# Patient Record
Sex: Male | Born: 1950
Health system: Southern US, Community
[De-identification: ages and names within clinical notes are randomized; demographics above are authoritative.]

## PROBLEM LIST (undated history)

## (undated) HISTORY — PX: MINOR CARPAL TUNNEL: SHX6472

---

## 2014-10-06 ENCOUNTER — Other Ambulatory Visit: Payer: Self-pay | Admitting: Physician Assistant

## 2014-10-06 DIAGNOSIS — R1030 Lower abdominal pain, unspecified: Secondary | ICD-10-CM

## 2014-10-08 ENCOUNTER — Ambulatory Visit
Admission: RE | Admit: 2014-10-08 | Discharge: 2014-10-08 | Disposition: A | Discharge status: Home / Self Care | Payer: 59 | Source: Ambulatory Visit | Attending: Physician Assistant | Admitting: Physician Assistant

## 2014-10-08 DIAGNOSIS — R1030 Lower abdominal pain, unspecified: Secondary | ICD-10-CM

## 2015-11-02 DIAGNOSIS — Z131 Encounter for screening for diabetes mellitus: Secondary | ICD-10-CM | POA: Diagnosis not present

## 2015-11-02 DIAGNOSIS — Z136 Encounter for screening for cardiovascular disorders: Secondary | ICD-10-CM | POA: Diagnosis not present

## 2015-11-02 DIAGNOSIS — Z0000 Encounter for general adult medical examination without abnormal findings: Secondary | ICD-10-CM | POA: Diagnosis not present

## 2015-11-02 DIAGNOSIS — Z23 Encounter for immunization: Secondary | ICD-10-CM | POA: Diagnosis not present

## 2015-11-02 DIAGNOSIS — Z125 Encounter for screening for malignant neoplasm of prostate: Secondary | ICD-10-CM | POA: Diagnosis not present

## 2015-11-02 DIAGNOSIS — L570 Actinic keratosis: Secondary | ICD-10-CM | POA: Diagnosis not present

## 2015-11-02 DIAGNOSIS — N400 Enlarged prostate without lower urinary tract symptoms: Secondary | ICD-10-CM | POA: Diagnosis not present

## 2016-02-09 DIAGNOSIS — D1801 Hemangioma of skin and subcutaneous tissue: Secondary | ICD-10-CM | POA: Diagnosis not present

## 2016-02-09 DIAGNOSIS — D0439 Carcinoma in situ of skin of other parts of face: Secondary | ICD-10-CM | POA: Diagnosis not present

## 2016-02-09 DIAGNOSIS — C44612 Basal cell carcinoma of skin of right upper limb, including shoulder: Secondary | ICD-10-CM | POA: Diagnosis not present

## 2016-02-09 DIAGNOSIS — L821 Other seborrheic keratosis: Secondary | ICD-10-CM | POA: Diagnosis not present

## 2016-02-09 DIAGNOSIS — L814 Other melanin hyperpigmentation: Secondary | ICD-10-CM | POA: Diagnosis not present

## 2016-02-09 DIAGNOSIS — L570 Actinic keratosis: Secondary | ICD-10-CM | POA: Diagnosis not present

## 2016-02-09 DIAGNOSIS — D045 Carcinoma in situ of skin of trunk: Secondary | ICD-10-CM | POA: Diagnosis not present

## 2016-02-09 DIAGNOSIS — D485 Neoplasm of uncertain behavior of skin: Secondary | ICD-10-CM | POA: Diagnosis not present

## 2016-03-01 DIAGNOSIS — C44519 Basal cell carcinoma of skin of other part of trunk: Secondary | ICD-10-CM | POA: Diagnosis not present

## 2016-03-01 DIAGNOSIS — D0439 Carcinoma in situ of skin of other parts of face: Secondary | ICD-10-CM | POA: Diagnosis not present

## 2016-08-18 DIAGNOSIS — L570 Actinic keratosis: Secondary | ICD-10-CM | POA: Diagnosis not present

## 2016-08-18 DIAGNOSIS — L821 Other seborrheic keratosis: Secondary | ICD-10-CM | POA: Diagnosis not present

## 2016-08-18 DIAGNOSIS — L814 Other melanin hyperpigmentation: Secondary | ICD-10-CM | POA: Diagnosis not present

## 2016-08-18 DIAGNOSIS — Z85828 Personal history of other malignant neoplasm of skin: Secondary | ICD-10-CM | POA: Diagnosis not present

## 2016-08-18 DIAGNOSIS — D1801 Hemangioma of skin and subcutaneous tissue: Secondary | ICD-10-CM | POA: Diagnosis not present

## 2016-09-22 DIAGNOSIS — L570 Actinic keratosis: Secondary | ICD-10-CM | POA: Diagnosis not present

## 2016-12-05 DIAGNOSIS — R7303 Prediabetes: Secondary | ICD-10-CM | POA: Diagnosis not present

## 2016-12-05 DIAGNOSIS — Z0000 Encounter for general adult medical examination without abnormal findings: Secondary | ICD-10-CM | POA: Diagnosis not present

## 2016-12-05 DIAGNOSIS — Z23 Encounter for immunization: Secondary | ICD-10-CM | POA: Diagnosis not present

## 2016-12-05 DIAGNOSIS — I10 Essential (primary) hypertension: Secondary | ICD-10-CM | POA: Diagnosis not present

## 2016-12-05 DIAGNOSIS — Z125 Encounter for screening for malignant neoplasm of prostate: Secondary | ICD-10-CM | POA: Diagnosis not present

## 2016-12-05 DIAGNOSIS — N400 Enlarged prostate without lower urinary tract symptoms: Secondary | ICD-10-CM | POA: Diagnosis not present

## 2017-01-03 DIAGNOSIS — L570 Actinic keratosis: Secondary | ICD-10-CM | POA: Diagnosis not present

## 2017-02-21 DIAGNOSIS — D1801 Hemangioma of skin and subcutaneous tissue: Secondary | ICD-10-CM | POA: Diagnosis not present

## 2017-02-21 DIAGNOSIS — Z85828 Personal history of other malignant neoplasm of skin: Secondary | ICD-10-CM | POA: Diagnosis not present

## 2017-02-21 DIAGNOSIS — D485 Neoplasm of uncertain behavior of skin: Secondary | ICD-10-CM | POA: Diagnosis not present

## 2017-02-21 DIAGNOSIS — L570 Actinic keratosis: Secondary | ICD-10-CM | POA: Diagnosis not present

## 2017-02-21 DIAGNOSIS — L821 Other seborrheic keratosis: Secondary | ICD-10-CM | POA: Diagnosis not present

## 2017-02-21 DIAGNOSIS — C44602 Unspecified malignant neoplasm of skin of right upper limb, including shoulder: Secondary | ICD-10-CM | POA: Diagnosis not present

## 2017-02-21 DIAGNOSIS — L814 Other melanin hyperpigmentation: Secondary | ICD-10-CM | POA: Diagnosis not present

## 2017-04-17 DIAGNOSIS — S52552A Other extraarticular fracture of lower end of left radius, initial encounter for closed fracture: Secondary | ICD-10-CM | POA: Diagnosis not present

## 2017-04-17 DIAGNOSIS — M25532 Pain in left wrist: Secondary | ICD-10-CM | POA: Diagnosis not present

## 2017-04-17 DIAGNOSIS — S20369A Insect bite (nonvenomous) of unspecified front wall of thorax, initial encounter: Secondary | ICD-10-CM | POA: Diagnosis not present

## 2017-04-17 DIAGNOSIS — W57XXXA Bitten or stung by nonvenomous insect and other nonvenomous arthropods, initial encounter: Secondary | ICD-10-CM | POA: Diagnosis not present

## 2017-04-19 DIAGNOSIS — C44622 Squamous cell carcinoma of skin of right upper limb, including shoulder: Secondary | ICD-10-CM | POA: Diagnosis not present

## 2017-05-01 DIAGNOSIS — S52552D Other extraarticular fracture of lower end of left radius, subsequent encounter for closed fracture with routine healing: Secondary | ICD-10-CM | POA: Diagnosis not present

## 2017-05-22 DIAGNOSIS — M25532 Pain in left wrist: Secondary | ICD-10-CM | POA: Diagnosis not present

## 2017-05-22 DIAGNOSIS — S52552D Other extraarticular fracture of lower end of left radius, subsequent encounter for closed fracture with routine healing: Secondary | ICD-10-CM | POA: Diagnosis not present

## 2017-06-15 DIAGNOSIS — R079 Chest pain, unspecified: Secondary | ICD-10-CM | POA: Diagnosis not present

## 2017-06-15 DIAGNOSIS — W11XXXA Fall on and from ladder, initial encounter: Secondary | ICD-10-CM | POA: Diagnosis not present

## 2017-06-19 DIAGNOSIS — M25532 Pain in left wrist: Secondary | ICD-10-CM | POA: Diagnosis not present

## 2017-06-19 DIAGNOSIS — S52552D Other extraarticular fracture of lower end of left radius, subsequent encounter for closed fracture with routine healing: Secondary | ICD-10-CM | POA: Diagnosis not present

## 2017-11-13 DIAGNOSIS — L570 Actinic keratosis: Secondary | ICD-10-CM | POA: Diagnosis not present

## 2017-11-13 DIAGNOSIS — L578 Other skin changes due to chronic exposure to nonionizing radiation: Secondary | ICD-10-CM | POA: Diagnosis not present

## 2017-11-13 DIAGNOSIS — L814 Other melanin hyperpigmentation: Secondary | ICD-10-CM | POA: Diagnosis not present

## 2017-11-13 DIAGNOSIS — Z85828 Personal history of other malignant neoplasm of skin: Secondary | ICD-10-CM | POA: Diagnosis not present

## 2017-11-13 DIAGNOSIS — L821 Other seborrheic keratosis: Secondary | ICD-10-CM | POA: Diagnosis not present

## 2018-05-07 DIAGNOSIS — Z85828 Personal history of other malignant neoplasm of skin: Secondary | ICD-10-CM | POA: Diagnosis not present

## 2018-05-07 DIAGNOSIS — L821 Other seborrheic keratosis: Secondary | ICD-10-CM | POA: Diagnosis not present

## 2018-05-07 DIAGNOSIS — L570 Actinic keratosis: Secondary | ICD-10-CM | POA: Diagnosis not present

## 2018-05-07 DIAGNOSIS — D225 Melanocytic nevi of trunk: Secondary | ICD-10-CM | POA: Diagnosis not present

## 2018-05-07 DIAGNOSIS — D1801 Hemangioma of skin and subcutaneous tissue: Secondary | ICD-10-CM | POA: Diagnosis not present

## 2018-11-12 DIAGNOSIS — L570 Actinic keratosis: Secondary | ICD-10-CM | POA: Diagnosis not present

## 2018-11-12 DIAGNOSIS — Z85828 Personal history of other malignant neoplasm of skin: Secondary | ICD-10-CM | POA: Diagnosis not present

## 2018-11-12 DIAGNOSIS — L578 Other skin changes due to chronic exposure to nonionizing radiation: Secondary | ICD-10-CM | POA: Diagnosis not present

## 2018-11-12 DIAGNOSIS — L600 Ingrowing nail: Secondary | ICD-10-CM | POA: Diagnosis not present

## 2018-11-27 ENCOUNTER — Encounter: Payer: Self-pay | Admitting: Podiatry

## 2018-11-27 ENCOUNTER — Ambulatory Visit (INDEPENDENT_AMBULATORY_CARE_PROVIDER_SITE_OTHER): Payer: Medicare Other | Admitting: Podiatry

## 2018-11-27 VITALS — BP 143/76 | HR 72

## 2018-11-27 DIAGNOSIS — L600 Ingrowing nail: Secondary | ICD-10-CM | POA: Diagnosis not present

## 2018-11-27 MED ORDER — GENTAMICIN SULFATE 0.1 % EX CREA: 1.0000 "application " | TOPICAL_CREAM | Freq: Two times a day (BID) | CUTANEOUS | 1 refills | Status: DC

## 2018-11-27 MED ORDER — DOXYCYCLINE HYCLATE 100 MG PO TABS: 100.0000 mg | ORAL_TABLET | Freq: Two times a day (BID) | ORAL | 0 refills | Status: DC

## 2018-11-29 ENCOUNTER — Telehealth: Payer: Self-pay | Admitting: Podiatry

## 2018-11-29 MED ORDER — GENTAMICIN SULFATE 0.1 % EX CREA: 1.0000 "application " | TOPICAL_CREAM | Freq: Two times a day (BID) | CUTANEOUS | 1 refills | Status: DC

## 2018-11-29 NOTE — Telephone Encounter (Signed)
I called and spoke with patient, he is going to call a couple of different pharmacies to see if he can get Gentamicin cream cheaper and let me know where to send it.  He stated that [facility] is too expensive

## 2018-11-29 NOTE — Progress Notes (Signed)
   Subjective: Patient presents today for evaluation of pain to the lateral border of the left hallux that began about one month ago. He reports associated redness, swelling and purulent drainage. Patient is concerned for possible ingrown nail. Wearing shoes increases the pain. He has not done anything for treatment. Patient presents today for further treatment and evaluation.  History reviewed. No pertinent past medical history.  Objective:  General: Well developed, nourished, in no acute distress, alert and oriented x3   Dermatology: Skin is warm, dry and supple bilateral. Lateral border of the left hallux appears to be erythematous with evidence of an ingrowing nail. Pain on palpation noted to the border of the nail fold. The remaining nails appear unremarkable at this time. There are no open sores, lesions.  Vascular: Dorsalis Pedis artery and Posterior Tibial artery pedal pulses palpable. No lower extremity edema noted.   Neruologic: Grossly intact via light touch bilateral.  Musculoskeletal: Muscular strength within normal limits in all groups bilateral. Normal range of motion noted to all pedal and ankle joints.   Assesement: #1 Paronychia with ingrowing nail lateral border left hallux  #2 Pain in toe #3 Incurvated nail  Plan of Care:  1. Patient evaluated.  2. Discussed treatment alternatives and plan of care. Explained nail avulsion procedure and post procedure course to patient. 3. Patient opted for temporary partial nail avulsion of the lateral border of the left hallux.  4. Prior to procedure, local anesthesia infiltration utilized using 3 ml of a 50:50 mixture of 2% plain lidocaine and 0.5% plain marcaine in a normal hallux block fashion and a betadine prep performed.  5. Light dressing applied. 6. Prescription for Gentamicin cream provided to patient to use daily with a bandage.  7. Prescription for Doxycycline #20 provided to patient.  8. Return to clinic in 3 weeks.    Edrick Kins, DPM [facility]  Dr. Edrick Kins, [address]                                        [place], [place] [address]                Office [phone]  Fax [phone]

## 2018-11-29 NOTE — Telephone Encounter (Signed)
I'm calling about the medication being worked on for me that's supposed to go to [facility].

## 2019-01-15 DIAGNOSIS — N400 Enlarged prostate without lower urinary tract symptoms: Secondary | ICD-10-CM | POA: Diagnosis not present

## 2019-01-15 DIAGNOSIS — Z0000 Encounter for general adult medical examination without abnormal findings: Secondary | ICD-10-CM | POA: Diagnosis not present

## 2020-12-17 DIAGNOSIS — D485 Neoplasm of uncertain behavior of skin: Secondary | ICD-10-CM | POA: Diagnosis not present

## 2020-12-17 DIAGNOSIS — L814 Other melanin hyperpigmentation: Secondary | ICD-10-CM | POA: Diagnosis not present

## 2020-12-17 DIAGNOSIS — L570 Actinic keratosis: Secondary | ICD-10-CM | POA: Diagnosis not present

## 2020-12-17 DIAGNOSIS — C44529 Squamous cell carcinoma of skin of other part of trunk: Secondary | ICD-10-CM | POA: Diagnosis not present

## 2020-12-17 DIAGNOSIS — L439 Lichen planus, unspecified: Secondary | ICD-10-CM | POA: Diagnosis not present

## 2020-12-17 DIAGNOSIS — L905 Scar conditions and fibrosis of skin: Secondary | ICD-10-CM | POA: Diagnosis not present

## 2020-12-17 DIAGNOSIS — C44519 Basal cell carcinoma of skin of other part of trunk: Secondary | ICD-10-CM | POA: Diagnosis not present

## 2020-12-25 ENCOUNTER — Ambulatory Visit
Admission: EM | Admit: 2020-12-25 | Discharge: 2020-12-25 | Disposition: A | Discharge status: Home / Self Care | Payer: Medicare Other | Attending: Emergency Medicine | Admitting: Emergency Medicine

## 2020-12-25 ENCOUNTER — Other Ambulatory Visit: Payer: Self-pay

## 2020-12-25 ENCOUNTER — Ambulatory Visit (INDEPENDENT_AMBULATORY_CARE_PROVIDER_SITE_OTHER): Payer: Medicare Other

## 2020-12-25 DIAGNOSIS — J189 Pneumonia, unspecified organism: Secondary | ICD-10-CM

## 2020-12-25 DIAGNOSIS — J90 Pleural effusion, not elsewhere classified: Secondary | ICD-10-CM | POA: Diagnosis not present

## 2020-12-25 DIAGNOSIS — R079 Chest pain, unspecified: Secondary | ICD-10-CM

## 2020-12-25 MED ORDER — AMOXICILLIN-POT CLAVULANATE 875-125 MG PO TABS: 1.0000 | ORAL_TABLET | Freq: Two times a day (BID) | ORAL | 0 refills | Status: AC

## 2020-12-25 MED ORDER — AZITHROMYCIN 250 MG PO TABS: 250.0000 mg | ORAL_TABLET | Freq: Every day | ORAL | 0 refills | Status: DC

## 2020-12-25 NOTE — ED Provider Notes (Signed)
[facility]    CSN: [id number] Arrival date & time: 12/25/20  1348      History   Chief Complaint Chief Complaint  Patient presents with  . Rib Injury         HPI Stephen Cortez is a 70 y.o. male presenting today for evaluation of right-sided rib pain.  Reports woke up this morning around 4 AM with discomfort in his right rib cage.  Had associated shortness of breath and discomfort when taking a deep breath.  Denies symptoms at rest.  He denies any recent fevers, has felt slightly achy of recently.  Denies any injury fall or trauma to this area.  Denies history of DVT/PE.  Denies leg pain or leg swelling.  Denies tobacco use.  Denies any recent travel immobilization or surgeries.  Patient reports he walks approximately 3 miles per day.  HPI  History reviewed. No pertinent past medical history.  There are no problems to display for this patient.   History reviewed. No pertinent surgical history.     Home Medications    Prior to Admission medications   Medication Sig Start Date End Date Taking? Authorizing Provider  amoxicillin-clavulanate (AUGMENTIN) 875-125 MG tablet Take 1 tablet by mouth every 12 (twelve) hours for 7 days. 12/25/20 01/01/21 Yes Enrique Weiss C, PA-C  azithromycin (ZITHROMAX) 250 MG tablet Take 1 tablet (250 mg total) by mouth daily. Take first 2 tablets together, then 1 every day until finished. 12/25/20  Yes Reveca Desmarais C, PA-C  gentamicin cream (GARAMYCIN) 0.1 % Apply 1 application topically 2 (two) times daily. 11/29/18   Edrick Kins, DPM    Family History History reviewed. No pertinent family history.  Social History Social History   Tobacco Use  . Smoking status: Never Smoker  . Smokeless tobacco: Never Used     Allergies   Patient has no known allergies.   Review of Systems Review of Systems  Constitutional: Negative for activity change, appetite change, chills, fatigue and fever.  HENT: Negative for congestion,  ear pain, rhinorrhea, sinus pressure, sore throat and trouble swallowing.   Eyes: Negative for discharge and redness.  Respiratory: Negative for cough, chest tightness and shortness of breath.   Cardiovascular: Positive for chest pain.  Gastrointestinal: Negative for abdominal pain, diarrhea, nausea and vomiting.  Musculoskeletal: Negative for myalgias.  Skin: Negative for rash.  Neurological: Negative for dizziness, light-headedness and headaches.     Physical Exam Triage Vital Signs ED Triage Vitals  Enc Vitals Group     BP 12/25/20 1406 104/63     Pulse Rate 12/25/20 1406 78     Resp 12/25/20 1406 18     Temp 12/25/20 1406 98.3 F (36.8 C)     Temp Source 12/25/20 1406 Oral     SpO2 12/25/20 1406 94 %     Weight --      Height --      Head Circumference --      Peak Flow --      Pain Score 12/25/20 1407 3     Pain Loc --      Pain Edu? --      Excl. in [place]? --    No data found.  Updated Vital Signs BP 104/63 (BP Location: Right Arm)   Pulse 78   Temp 98.3 F (36.8 C) (Oral)   Resp 18   SpO2 94%   Visual Acuity Right Eye Distance:   Left Eye Distance:   Bilateral Distance:  Right Eye Near:   Left Eye Near:    Bilateral Near:     Physical Exam Vitals and nursing note reviewed.  Constitutional:      Appearance: He is well-developed.     Comments: No acute distress  HENT:     Head: Normocephalic and atraumatic.     Nose: Nose normal.  Eyes:     Conjunctiva/sclera: Conjunctivae normal.  Cardiovascular:     Rate and Rhythm: Normal rate.  Pulmonary:     Effort: Pulmonary effort is normal. No respiratory distress.     Comments: Breathing comfortably at rest, CTABL, no wheezing, rales or other adventitious sounds auscultated  No reproducible chest tenderness Abdominal:     General: There is no distension.  Musculoskeletal:        General: Normal range of motion.     Cervical back: Neck supple.     Comments: Bilateral lower legs without swelling or  calf tenderness  Skin:    General: Skin is warm and dry.  Neurological:     Mental Status: He is alert and oriented to person, place, and time.      UC Treatments / Results  Labs (all labs ordered are listed, but only abnormal results are displayed) Labs Reviewed - No data to display  EKG   Radiology No results found.  Procedures Procedures (including critical care time)  Medications Ordered in UC Medications - No data to display  Initial Impression / Assessment and Plan / UC Course  I have reviewed the triage vital signs and the nursing notes.  Pertinent labs & imaging results that were available during my care of the patient were reviewed by me and considered in my medical decision making (see chart for details).     Right middle lobe pneumonia-initiating on Augmentin and azithromycin, rest and fluids.  Advised to monitor for improvement in pain, low suspicion of DVT, no tachycardia, negative risk factors, but encourage patient to follow-up in emergency room if developing worsening symptoms/pain.  Discussed strict return precautions. Patient verbalized understanding and is agreeable with plan.  Final Clinical Impressions(s) / UC Diagnoses   Final diagnoses:  Pneumonia of right middle lobe due to infectious organism     Discharge Instructions     Begin Augmentin twice daily for 1 week Azithromycin-2 tablets today, 1 tablet for the following 4 days Rest and fluids Follow-up to ensure resolution in approximately 4 weeks     ED Prescriptions    Medication Sig Dispense Auth. Provider   azithromycin (ZITHROMAX) 250 MG tablet Take 1 tablet (250 mg total) by mouth daily. Take first 2 tablets together, then 1 every day until finished. 6 tablet Mishawn Didion C, PA-C   amoxicillin-clavulanate (AUGMENTIN) 875-125 MG tablet Take 1 tablet by mouth every 12 (twelve) hours for 7 days. 14 tablet Allina Riches, [place] C, PA-C     PDMP not reviewed this encounter.   Janith Lima, PA-C 12/25/20 1547

## 2020-12-25 NOTE — ED Triage Notes (Signed)
Pt present rib cage pain. Pt states he felt the pain today. Pt states he cannot take a full deep breath. Pt states he is not sure if he injured his rib but it is painful

## 2020-12-25 NOTE — Discharge Instructions (Signed)
Begin Augmentin twice daily for 1 week Azithromycin-2 tablets today, 1 tablet for the following 4 days Rest and fluids Follow-up to ensure resolution in approximately 4 weeks

## 2021-01-11 DIAGNOSIS — C44529 Squamous cell carcinoma of skin of other part of trunk: Secondary | ICD-10-CM | POA: Diagnosis not present

## 2021-01-11 DIAGNOSIS — L570 Actinic keratosis: Secondary | ICD-10-CM | POA: Diagnosis not present

## 2021-02-09 ENCOUNTER — Ambulatory Visit (INDEPENDENT_AMBULATORY_CARE_PROVIDER_SITE_OTHER): Payer: Medicare Other

## 2021-02-09 ENCOUNTER — Telehealth: Payer: Self-pay | Admitting: Emergency Medicine

## 2021-02-09 ENCOUNTER — Encounter: Payer: Self-pay | Admitting: Emergency Medicine

## 2021-02-09 ENCOUNTER — Ambulatory Visit (INDEPENDENT_AMBULATORY_CARE_PROVIDER_SITE_OTHER): Payer: Medicare Other | Admitting: Emergency Medicine

## 2021-02-09 ENCOUNTER — Other Ambulatory Visit: Payer: Self-pay

## 2021-02-09 ENCOUNTER — Telehealth: Payer: Self-pay

## 2021-02-09 VITALS — BP 120/70 | HR 63 | Temp 98.0°F | Ht 67.0 in | Wt 183.6 lb

## 2021-02-09 DIAGNOSIS — M545 Low back pain, unspecified: Secondary | ICD-10-CM | POA: Diagnosis not present

## 2021-02-09 DIAGNOSIS — Z7689 Persons encountering health services in other specified circumstances: Secondary | ICD-10-CM | POA: Diagnosis not present

## 2021-02-09 DIAGNOSIS — Z8701 Personal history of pneumonia (recurrent): Secondary | ICD-10-CM | POA: Diagnosis not present

## 2021-02-09 DIAGNOSIS — J9811 Atelectasis: Secondary | ICD-10-CM | POA: Diagnosis not present

## 2021-02-09 DIAGNOSIS — M549 Dorsalgia, unspecified: Secondary | ICD-10-CM

## 2021-02-09 DIAGNOSIS — J189 Pneumonia, unspecified organism: Secondary | ICD-10-CM | POA: Diagnosis not present

## 2021-02-09 NOTE — Assessment & Plan Note (Signed)
Chest x-ray shows resolved pneumonia findings.  Clinically improved and asymptomatic.

## 2021-02-09 NOTE — Assessment & Plan Note (Signed)
Musculoskeletal in nature.  No red flag signs or symptoms.  X-rays show some degenerative joint disease and arthritic changes.  Recommend to use Tylenol and or NSAIDs as needed.  Encouraged to stay active and also use cold or hot compresses as needed.

## 2021-02-09 NOTE — Telephone Encounter (Signed)
Mild chronic bronchitic changes with resolved RIGHT lower lung infiltrates since previous exam.  Mild residual RIGHT basilar atelectasis.  Question RIGHT nipple shadow; PA chest radiograph with nipple markers recommended to exclude pulmonary nodule.

## 2021-02-09 NOTE — Telephone Encounter (Signed)
X-ray reports shared with patient.

## 2021-02-09 NOTE — Telephone Encounter (Signed)
Got it!     Thanks =).

## 2021-02-09 NOTE — Progress Notes (Signed)
Stephen Cortez 70 y.o.   Chief Complaint  Patient presents with  . Establish Care  . Back Pain    Per patient lower area started 2-3 weeks ago    HISTORY OF PRESENT ILLNESS: This is a 70 y.o. male here to establish care with me. Also complaining of lower back pain for the past 2 to 3 weeks which started after heavy lifting and very physical work. Sharp pain localized to lumbar area worse in the morning and better with movement and physical activity.  No pain radiation. Denies bowel or bladder symptoms.  No other associated symptoms. Also had pneumonia last March.  Went to urgent care center and had x-ray which showed right middle lobe airspace disease most consistent with pneumonia.  Treated with azithromycin.  No complications.  Improved 100%.  However he was sent a letter asking him to follow-up with repeat chest x-ray. Patient denies any chronic medical problems.  No chronic medications. No other complaints or medical concerns today.  HPI   Prior to Admission medications   Medication Sig Start Date End Date Taking? Authorizing Provider  azithromycin (ZITHROMAX) 250 MG tablet Take 1 tablet (250 mg total) by mouth daily. Take first 2 tablets together, then 1 every day until finished. Patient not taking: Reported on 02/09/2021 12/25/20   Wieters, Madelynn Done C, PA-C  gentamicin cream (GARAMYCIN) 0.1 % Apply 1 application topically 2 (two) times daily. Patient not taking: Reported on 02/09/2021 11/29/18   Edrick Kins, DPM    No Known Allergies  There are no problems to display for this patient.   History reviewed. No pertinent past medical history.  History reviewed. No pertinent surgical history.  Social History   Socioeconomic History  . Marital status: Married    Spouse name: Not on file  . Number of children: Not on file  . Years of education: Not on file  . Highest education level: Not on file  Occupational History  . Not on file  Tobacco Use  . Smoking status: Never  Smoker  . Smokeless tobacco: Never Used  Substance and Sexual Activity  . Alcohol use: Not on file  . Drug use: Not on file  . Sexual activity: Not on file  Other Topics Concern  . Not on file  Social History Narrative  . Not on file   Social Determinants of Health   Financial Resource Strain: Not on file  Food Insecurity: Not on file  Transportation Needs: Not on file  Physical Activity: Not on file  Stress: Not on file  Social Connections: Not on file  Intimate Partner Violence: Not on file    History reviewed. No pertinent family history.   Review of Systems  Constitutional: Negative.  Negative for chills and fever.  HENT: Negative.  Negative for congestion and sore throat.   Respiratory: Negative.  Negative for cough, hemoptysis, sputum production, shortness of breath and wheezing.   Cardiovascular: Negative.  Negative for chest pain and palpitations.  Gastrointestinal: Negative for abdominal pain, diarrhea, nausea and vomiting.  Genitourinary: Negative.  Negative for dysuria and hematuria.  Skin: Negative.  Negative for rash.  Neurological: Negative.  Negative for dizziness and headaches.  All other systems reviewed and are negative.   Today's Vitals   02/09/21 1114  BP: 120/70  Pulse: 63  Temp: 98 F (36.7 C)  TempSrc: Oral  SpO2: 98%  Weight: 183 lb 9.6 oz (83.3 kg)  Height: 5\' 7"  (1.702 m)   Body mass index is 28.76  kg/m. Wt Readings from Last 3 Encounters:  02/09/21 183 lb 9.6 oz (83.3 kg)    Physical Exam Vitals reviewed.  Constitutional:      Appearance: Normal appearance.  HENT:     Head: Normocephalic.  Eyes:     Extraocular Movements: Extraocular movements intact.     Conjunctiva/sclera: Conjunctivae normal.     Pupils: Pupils are equal, round, and reactive to light.  Cardiovascular:     Rate and Rhythm: Normal rate and regular rhythm.     Pulses: Normal pulses.     Heart sounds: Normal heart sounds.  Pulmonary:     Effort: Pulmonary  effort is normal.     Breath sounds: Normal breath sounds.  Abdominal:     General: Bowel sounds are normal. There is no distension.     Palpations: Abdomen is soft.     Tenderness: There is no abdominal tenderness.  Musculoskeletal:        General: Normal range of motion.     Cervical back: Normal range of motion and neck supple.     Lumbar back: No spasms, tenderness or bony tenderness. Normal range of motion. Negative right straight leg raise test and negative left straight leg raise test.  Skin:    General: Skin is warm and dry.     Capillary Refill: Capillary refill takes less than 2 seconds.  Neurological:     General: No focal deficit present.     Mental Status: He is alert and oriented to person, place, and time.     Sensory: No sensory deficit.     Motor: No weakness.     Coordination: Coordination normal.     Gait: Gait normal.     Deep Tendon Reflexes: Reflexes normal.  Psychiatric:        Mood and Affect: Mood normal.        Behavior: Behavior normal.    DG Chest 2 View  Result Date: 02/09/2021 CLINICAL DATA:  Recent pneumonia, follow-up EXAM: CHEST - 2 VIEW COMPARISON:  12/25/2020 FINDINGS: Normal heart size and pulmonary vascularity. Tortuosity of thoracic aorta. Chronic bronchitic changes with improved infiltrate in lower RIGHT lung. Residual atelectasis at minor fissure. Remaining lungs clear. Question nodular density inferior RIGHT chest versus nipple shadow. No pneumothorax or acute osseous findings. IMPRESSION: Mild chronic bronchitic changes with resolved RIGHT lower lung infiltrates since previous exam. Mild residual RIGHT basilar atelectasis. Question RIGHT nipple shadow; PA chest radiograph with nipple markers recommended to exclude pulmonary nodule. These results will be called to the ordering clinician or representative by the Radiologist Assistant, and communication documented in the PACS or [employer]. Electronically Signed   By: Lavonia Dana M.D.   On:  02/09/2021 12:14   DG Lumbar Spine 2-3 Views  Result Date: 02/09/2021 CLINICAL DATA:  Low back pain for 3 weeks, no known injury EXAM: LUMBAR SPINE - 2-3 VIEW COMPARISON:  None FINDINGS: Osseous demineralization. Hypoplastic last LEFT ribs. Five non-rib-bearing lumbar vertebra. Dextroconvex scoliosis with scattered disc space narrowing and endplate spur formation greatest at L2-L3. No fracture, subluxation, or bone destruction. No spondylolysis. Stable radiopacities within stool. Cannot exclude small LEFT renal calculi up to 3 mm diameter. IMPRESSION: Degenerative disc disease changes lumbar spine with dextroconvex scoliosis and osseous demineralization. No acute osseous findings. Cannot exclude tiny LEFT renal calculi. Electronically Signed   By: Lavonia Dana M.D.   On: 02/09/2021 12:17     ASSESSMENT & PLAN: History of recent pneumonia Chest x-ray shows resolved pneumonia findings.  Clinically improved and asymptomatic.  Lumbar pain Musculoskeletal in nature.  No red flag signs or symptoms.  X-rays show some degenerative joint disease and arthritic changes.  Recommend to use Tylenol and or NSAIDs as needed.  Encouraged to stay active and also use cold or hot compresses as needed.  Carrington was seen today for establish care and back pain.  Diagnoses and all orders for this visit:  Lumbar pain -     DG Lumbar Spine 2-3 Views  History of recent pneumonia -     DG Chest 2 View  Encounter to establish care  Musculoskeletal back pain    Patient Instructions   Acute Back Pain, Adult Acute back pain is sudden and usually short-lived. It is often caused by an injury to the muscles and tissues in the back. The injury may result from:  A muscle or ligament getting overstretched or torn (strained). Ligaments are tissues that connect bones to each other. Lifting something improperly can cause a back strain.  Wear and tear (degeneration) of the spinal disks. Spinal disks are circular tissue  that provide cushioning between the bones of the spine (vertebrae).  Twisting motions, such as while playing sports or doing yard work.  A hit to the back.  Arthritis. You may have a physical exam, lab tests, and imaging tests to find the cause of your pain. Acute back pain usually goes away with rest and home care. Follow these instructions at home: Managing pain, stiffness, and swelling  Treatment may include medicines for pain and inflammation that are taken by mouth or applied to the skin, prescription pain medicine, or muscle relaxants. Take over-the-counter and prescription medicines only as told by your health care provider.  Your health care provider may recommend applying ice during the first 24-48 hours after your pain starts. To do this: ? Put ice in a plastic bag. ? Place a towel between your skin and the bag. ? Leave the ice on for 20 minutes, 2-3 times a day.  If directed, apply heat to the affected area as often as told by your health care provider. Use the heat source that your health care provider recommends, such as a moist heat pack or a heating pad. ? Place a towel between your skin and the heat source. ? Leave the heat on for 20-30 minutes. ? Remove the heat if your skin turns bright red. This is especially important if you are unable to feel pain, heat, or cold. You have a greater risk of getting burned. Activity  Do not stay in bed. Staying in bed for more than 1-2 days can delay your recovery.  Sit up and stand up straight. Avoid leaning forward when you sit or hunching over when you stand. ? If you work at a desk, sit close to it so you do not need to lean over. Keep your chin tucked in. Keep your neck drawn back, and keep your elbows bent at a 90-degree angle (right angle). ? Sit high and close to the steering wheel when you drive. Add lower back (lumbar) support to your car seat, if needed.  Take short walks on even surfaces as soon as you are able. Try to  increase the length of time you walk each day.  Do not sit, drive, or stand in one place for more than 30 minutes at a time. Sitting or standing for long periods of time can put stress on your back.  Do not drive or use heavy machinery while  taking prescription pain medicine.  Use proper lifting techniques. When you bend and lift, use positions that put less stress on your back: ? Shannon your knees. ? Keep the load close to your body. ? Avoid twisting.  Exercise regularly as told by your health care provider. Exercising helps your back heal faster and helps prevent back injuries by keeping muscles strong and flexible.  Work with a physical therapist to make a safe exercise program, as recommended by your health care provider. Do any exercises as told by your physical therapist.   Lifestyle  Maintain a healthy weight. Extra weight puts stress on your back and makes it difficult to have good posture.  Avoid activities or situations that make you feel anxious or stressed. Stress and anxiety increase muscle tension and can make back pain worse. Learn ways to manage anxiety and stress, such as through exercise. General instructions  Sleep on a firm mattress in a comfortable position. Try lying on your side with your knees slightly bent. If you lie on your back, put a pillow under your knees.  Follow your treatment plan as told by your health care provider. This may include: ? Cognitive or behavioral therapy. ? Acupuncture or massage therapy. ? Meditation or yoga. Contact a health care provider if:  You have pain that is not relieved with rest or medicine.  You have increasing pain going down into your legs or buttocks.  Your pain does not improve after 2 weeks.  You have pain at night.  You lose weight without trying.  You have a fever or chills. Get help right away if:  You develop new bowel or bladder control problems.  You have unusual weakness or numbness in your arms or  legs.  You develop nausea or vomiting.  You develop abdominal pain.  You feel faint. Summary  Acute back pain is sudden and usually short-lived.  Use proper lifting techniques. When you bend and lift, use positions that put less stress on your back.  Take over-the-counter and prescription medicines and apply heat or ice as directed by your health care provider. This information is not intended to replace advice given to you by your health care provider. Make sure you discuss any questions you have with your health care provider. Document Revised: 06/19/2020 Document Reviewed: 06/19/2020 Elsevier Patient Education  2021 Max, MD [facility]

## 2021-02-09 NOTE — Patient Instructions (Signed)
Acute Back Pain, Adult Acute back pain is sudden and usually short-lived. It is often caused by an injury to the muscles and tissues in the back. The injury may result from:  A muscle or ligament getting overstretched or torn (strained). Ligaments are tissues that connect bones to each other. Lifting something improperly can cause a back strain.  Wear and tear (degeneration) of the spinal disks. Spinal disks are circular tissue that provide cushioning between the bones of the spine (vertebrae).  Twisting motions, such as while playing sports or doing yard work.  A hit to the back.  Arthritis. You may have a physical exam, lab tests, and imaging tests to find the cause of your pain. Acute back pain usually goes away with rest and home care. Follow these instructions at home: Managing pain, stiffness, and swelling  Treatment may include medicines for pain and inflammation that are taken by mouth or applied to the skin, prescription pain medicine, or muscle relaxants. Take over-the-counter and prescription medicines only as told by your health care provider.  Your health care provider may recommend applying ice during the first 24-48 hours after your pain starts. To do this: ? Put ice in a plastic bag. ? Place a towel between your skin and the bag. ? Leave the ice on for 20 minutes, 2-3 times a day.  If directed, apply heat to the affected area as often as told by your health care provider. Use the heat source that your health care provider recommends, such as a moist heat pack or a heating pad. ? Place a towel between your skin and the heat source. ? Leave the heat on for 20-30 minutes. ? Remove the heat if your skin turns bright red. This is especially important if you are unable to feel pain, heat, or cold. You have a greater risk of getting burned. Activity  Do not stay in bed. Staying in bed for more than 1-2 days can delay your recovery.  Sit up and stand up straight. Avoid leaning  forward when you sit or hunching over when you stand. ? If you work at a desk, sit close to it so you do not need to lean over. Keep your chin tucked in. Keep your neck drawn back, and keep your elbows bent at a 90-degree angle (right angle). ? Sit high and close to the steering wheel when you drive. Add lower back (lumbar) support to your car seat, if needed.  Take short walks on even surfaces as soon as you are able. Try to increase the length of time you walk each day.  Do not sit, drive, or stand in one place for more than 30 minutes at a time. Sitting or standing for long periods of time can put stress on your back.  Do not drive or use heavy machinery while taking prescription pain medicine.  Use proper lifting techniques. When you bend and lift, use positions that put less stress on your back: ? Bend your knees. ? Keep the load close to your body. ? Avoid twisting.  Exercise regularly as told by your health care provider. Exercising helps your back heal faster and helps prevent back injuries by keeping muscles strong and flexible.  Work with a physical therapist to make a safe exercise program, as recommended by your health care provider. Do any exercises as told by your physical therapist.   Lifestyle  Maintain a healthy weight. Extra weight puts stress on your back and makes it difficult to have   good posture.  Avoid activities or situations that make you feel anxious or stressed. Stress and anxiety increase muscle tension and can make back pain worse. Learn ways to manage anxiety and stress, such as through exercise. General instructions  Sleep on a firm mattress in a comfortable position. Try lying on your side with your knees slightly bent. If you lie on your back, put a pillow under your knees.  Follow your treatment plan as told by your health care provider. This may include: ? Cognitive or behavioral therapy. ? Acupuncture or massage therapy. ? Meditation or yoga. Contact  a health care provider if:  You have pain that is not relieved with rest or medicine.  You have increasing pain going down into your legs or buttocks.  Your pain does not improve after 2 weeks.  You have pain at night.  You lose weight without trying.  You have a fever or chills. Get help right away if:  You develop new bowel or bladder control problems.  You have unusual weakness or numbness in your arms or legs.  You develop nausea or vomiting.  You develop abdominal pain.  You feel faint. Summary  Acute back pain is sudden and usually short-lived.  Use proper lifting techniques. When you bend and lift, use positions that put less stress on your back.  Take over-the-counter and prescription medicines and apply heat or ice as directed by your health care provider. This information is not intended to replace advice given to you by your health care provider. Make sure you discuss any questions you have with your health care provider. Document Revised: 06/19/2020 Document Reviewed: 06/19/2020 Elsevier Patient Education  2021 [employer]  

## 2021-02-23 DIAGNOSIS — L244 Irritant contact dermatitis due to drugs in contact with skin: Secondary | ICD-10-CM | POA: Diagnosis not present

## 2021-03-24 DIAGNOSIS — L905 Scar conditions and fibrosis of skin: Secondary | ICD-10-CM | POA: Diagnosis not present

## 2021-03-24 DIAGNOSIS — C44519 Basal cell carcinoma of skin of other part of trunk: Secondary | ICD-10-CM | POA: Diagnosis not present

## 2021-12-08 DIAGNOSIS — L570 Actinic keratosis: Secondary | ICD-10-CM | POA: Diagnosis not present

## 2021-12-08 DIAGNOSIS — L814 Other melanin hyperpigmentation: Secondary | ICD-10-CM | POA: Diagnosis not present

## 2021-12-08 DIAGNOSIS — L821 Other seborrheic keratosis: Secondary | ICD-10-CM | POA: Diagnosis not present

## 2021-12-08 DIAGNOSIS — D1801 Hemangioma of skin and subcutaneous tissue: Secondary | ICD-10-CM | POA: Diagnosis not present

## 2022-01-05 DIAGNOSIS — L244 Irritant contact dermatitis due to drugs in contact with skin: Secondary | ICD-10-CM | POA: Diagnosis not present

## 2022-06-03 ENCOUNTER — Ambulatory Visit
Admission: EM | Admit: 2022-06-03 | Discharge: 2022-06-03 | Disposition: A | Discharge status: Home / Self Care | Payer: Medicare Other | Attending: Physician Assistant | Admitting: Physician Assistant

## 2022-06-03 ENCOUNTER — Ambulatory Visit (INDEPENDENT_AMBULATORY_CARE_PROVIDER_SITE_OTHER): Payer: Medicare Other

## 2022-06-03 DIAGNOSIS — Z23 Encounter for immunization: Secondary | ICD-10-CM

## 2022-06-03 DIAGNOSIS — L089 Local infection of the skin and subcutaneous tissue, unspecified: Secondary | ICD-10-CM | POA: Diagnosis not present

## 2022-06-03 DIAGNOSIS — S61210A Laceration without foreign body of right index finger without damage to nail, initial encounter: Secondary | ICD-10-CM

## 2022-06-03 DIAGNOSIS — M7989 Other specified soft tissue disorders: Secondary | ICD-10-CM

## 2022-06-03 MED ORDER — MUPIROCIN 2 % EX OINT: 1.0000 | TOPICAL_OINTMENT | Freq: Two times a day (BID) | CUTANEOUS | 0 refills | Status: DC

## 2022-06-03 MED ORDER — SULFAMETHOXAZOLE-TRIMETHOPRIM 800-160 MG PO TABS: 1.0000 | ORAL_TABLET | Freq: Two times a day (BID) | ORAL | 0 refills | Status: AC

## 2022-06-03 MED ORDER — TETANUS-DIPHTH-ACELL PERTUSSIS 5-2.5-18.5 LF-MCG/0.5 IM SUSY
0.5000 mL | PREFILLED_SYRINGE | Freq: Once | INTRAMUSCULAR | Status: AC
  Administered 2022-06-03: 0.5 mL via INTRAMUSCULAR

## 2022-06-03 MED ORDER — CEPHALEXIN 500 MG PO CAPS: 500.0000 mg | ORAL_CAPSULE | Freq: Three times a day (TID) | ORAL | 0 refills | Status: DC

## 2022-06-03 NOTE — Discharge Instructions (Signed)
Your x-ray showed swelling but was otherwise normal.  I am concerned that you have an infection that is causing your swelling.  Please start cephalexin 3 times daily for 1 week.  Start sulfamethoxazole-trimethoprim twice daily for 1 week.  Keep area clean and apply Bactroban ointment.  If your symptoms or not improving within a few days call and schedule appoint with hand specialist.  If you develop any increased pain, swelling, fever, nausea, vomiting, numbness or tingling, decreased range of motion you need to go to the emergency room immediately.

## 2022-06-03 NOTE — ED Provider Notes (Signed)
[facility]    CSN: [id number] Arrival date & time: 06/03/22  1338      History   Chief Complaint Chief Complaint  Patient presents with   Laceration    HPI Stephen Cortez is a 71 y.o. male.   Patient presents today with a 6-day history of right index finger pain and swelling following injury.  Reports that he cut himself while working on a car.  He initially cleaned this area with soap and water and applied Neosporin.  He then would have recurrent bleeding so applied liquid bandage.  This did achieve hemostasis but the following day he woke up with pain and swelling of the finger.  Reports that pain is rated 5 on a 0-10 pain scale, localized to affected area, described as throbbing/pressure, no alleviating factors identified.  He is right-handed.  Denies any numbness or paresthesias.  He denies history of gout or rheumatoid arthritis.  Denies history of recurrent skin infections, MRSA, diabetes, immunosuppression.  Denies any fever, nausea, vomiting.    History reviewed. No pertinent past medical history.  Patient Active Problem List   Diagnosis Date Noted   Lumbar pain 02/09/2021   History of recent pneumonia 02/09/2021    History reviewed. No pertinent surgical history.     Home Medications    Prior to Admission medications   Medication Sig Start Date End Date Taking? Authorizing Provider  cephALEXin (KEFLEX) 500 MG capsule Take 1 capsule (500 mg total) by mouth 3 (three) times daily. 06/03/22  Yes Maryanne Huneycutt, Junie Panning K, PA-C  mupirocin ointment (BACTROBAN) 2 % Apply 1 Application topically 2 (two) times daily. 06/03/22  Yes Teegan Guinther K, PA-C  sulfamethoxazole-trimethoprim (BACTRIM DS) 800-160 MG tablet Take 1 tablet by mouth 2 (two) times daily for 7 days. 06/03/22 06/10/22 Yes Gertha Lichtenberg, Derry Skill, PA-C    Family History History reviewed. No pertinent family history.  Social History Social History   Tobacco Use   Smoking status: Never   Smokeless tobacco:  Never     Allergies   Patient has no known allergies.   Review of Systems Review of Systems  Constitutional:  Positive for activity change. Negative for appetite change, fatigue and fever.  Gastrointestinal:  Negative for abdominal pain, diarrhea, nausea and vomiting.  Musculoskeletal:  Positive for arthralgias, joint swelling and myalgias.  Skin:  Positive for wound. Negative for color change.  Neurological:  Negative for dizziness, weakness, light-headedness, numbness and headaches.     Physical Exam Triage Vital Signs ED Triage Vitals  Enc Vitals Group     BP 06/03/22 1350 117/81     Pulse Rate 06/03/22 1350 100     Resp 06/03/22 1350 15     Temp 06/03/22 1350 98 F (36.7 C)     Temp Source 06/03/22 1350 Oral     SpO2 06/03/22 1350 96 %     Weight --      Height --      Head Circumference --      Peak Flow --      Pain Score 06/03/22 1351 4     Pain Loc --      Pain Edu? --      Excl. in [place]? --    No data found.  Updated Vital Signs BP 117/81 (BP Location: Left Arm)   Pulse 100   Temp 98 F (36.7 C) (Oral)   Resp 15   SpO2 96%   Visual Acuity Right Eye Distance:   Left Eye  Distance:   Bilateral Distance:    Right Eye Near:   Left Eye Near:    Bilateral Near:     Physical Exam Vitals reviewed.  Constitutional:      General: He is awake.     Appearance: Normal appearance. He is well-developed. He is not ill-appearing.     Comments: Very pleasant male appears stated age in no acute distress sitting comfortably in exam room  HENT:     Head: Normocephalic and atraumatic.  Cardiovascular:     Rate and Rhythm: Normal rate and regular rhythm.     Heart sounds: Normal heart sounds, S1 normal and S2 normal. No murmur heard. Pulmonary:     Effort: Pulmonary effort is normal.     Breath sounds: Normal breath sounds. No stridor. No wheezing, rhonchi or rales.     Comments: Clear to auscultation bilaterally Musculoskeletal:     Right hand: Swelling,  laceration and tenderness present. No deformity or bony tenderness. Normal range of motion. Normal strength. There is no disruption of two-point discrimination. Normal capillary refill.     Comments: Right hand: Significant swelling noted of right index finger without deformity.  Normal active range of motion with flexion and extension.  Hand neurovascularly intact.  2 cm laceration noted over dorsal PIP joint.  No bleeding or drainage noted.  Neurological:     Mental Status: He is alert.  Psychiatric:        Behavior: Behavior is cooperative.        UC Treatments / Results  Labs (all labs ordered are listed, but only abnormal results are displayed) Labs Reviewed - No data to display  EKG   Radiology DG Finger Index Right  Result Date: 06/03/2022 CLINICAL DATA:  Pain and swelling after injury. Laceration of RIGHT pointer finger on Sunday. EXAM: RIGHT INDEX FINGER 2+V COMPARISON:  None Available. FINDINGS: There is significant soft tissue swelling of the index finger. There is no radiopaque foreign body or soft tissue gas. No acute fracture. IMPRESSION: Soft tissue swelling. Electronically Signed   By: Nolon Nations M.D.   On: 06/03/2022 14:21    Procedures Procedures (including critical care time)  Medications Ordered in UC Medications  Tdap (BOOSTRIX) injection 0.5 mL (0.5 mLs Intramuscular Given 06/03/22 1421)    Initial Impression / Assessment and Plan / UC Course  I have reviewed the triage vital signs and the nursing notes.  Pertinent labs & imaging results that were available during my care of the patient were reviewed by me and considered in my medical decision making (see chart for details).     No indication for primary closure as laceration occurred 6 days ago.  Given significant swelling and pain x-ray was obtained which showed no evidence of osteomyelitis.  Concern for infection given swelling and pain.  We will start patient on antibiotics.  Patient was  prescribed cephalexin 500 mg 3 times daily for 1 week and Bactrim DS twice daily for 1 week.  Recommended needs to keep area clean and apply Bactroban ointment with dressing changes.  Can use Tylenol ibuprofen for pain relief.  Tetanus was updated today as previous tetanus was last given 12/07/2016.  Recommended that he follow-up with hand specialist if symptoms are improving very quickly.  He was given contact information for local provider with instruction to call to schedule an appointment.  If he has any worsening symptoms including erythema, increased swelling, increased pain, decreased range of motion, numbness, paresthesias, fever, nausea, vomiting he needs  to go to the emergency room to which he expressed understanding.  Strict return precautions given.  Work excuse note provided.  Final Clinical Impressions(s) / UC Diagnoses   Final diagnoses:  Laceration of right index finger without foreign body without damage to nail, initial encounter  Finger swelling  Finger infection     Discharge Instructions      Your x-ray showed swelling but was otherwise normal.  I am concerned that you have an infection that is causing your swelling.  Please start cephalexin 3 times daily for 1 week.  Start sulfamethoxazole-trimethoprim twice daily for 1 week.  Keep area clean and apply Bactroban ointment.  If your symptoms or not improving within a few days call and schedule appoint with hand specialist.  If you develop any increased pain, swelling, fever, nausea, vomiting, numbness or tingling, decreased range of motion you need to go to the emergency room immediately.     ED Prescriptions     Medication Sig Dispense Auth. Provider   sulfamethoxazole-trimethoprim (BACTRIM DS) 800-160 MG tablet Take 1 tablet by mouth 2 (two) times daily for 7 days. 14 tablet Jesenia Spera K, PA-C   cephALEXin (KEFLEX) 500 MG capsule Take 1 capsule (500 mg total) by mouth 3 (three) times daily. 21 capsule Elizzie Westergard K,  PA-C   mupirocin ointment (BACTROBAN) 2 % Apply 1 Application topically 2 (two) times daily. 22 g Janell Keeling K, PA-C      PDMP not reviewed this encounter.   Terrilee Croak, PA-C 06/03/22 1444

## 2022-06-03 NOTE — ED Triage Notes (Signed)
Pt c/o doing something to his car and lacerated his right pointer finger Sunday. Finger appears quite edematous.

## 2022-06-18 IMAGING — DX DG LUMBAR SPINE 2-3V
3 series · 3 of 3 positions shown · non-contrast
Comparison: None

CLINICAL DATA: Low back pain for 3 weeks, no known injury

EXAM:
LUMBAR SPINE - 2-3 VIEW

[l-spine ap]
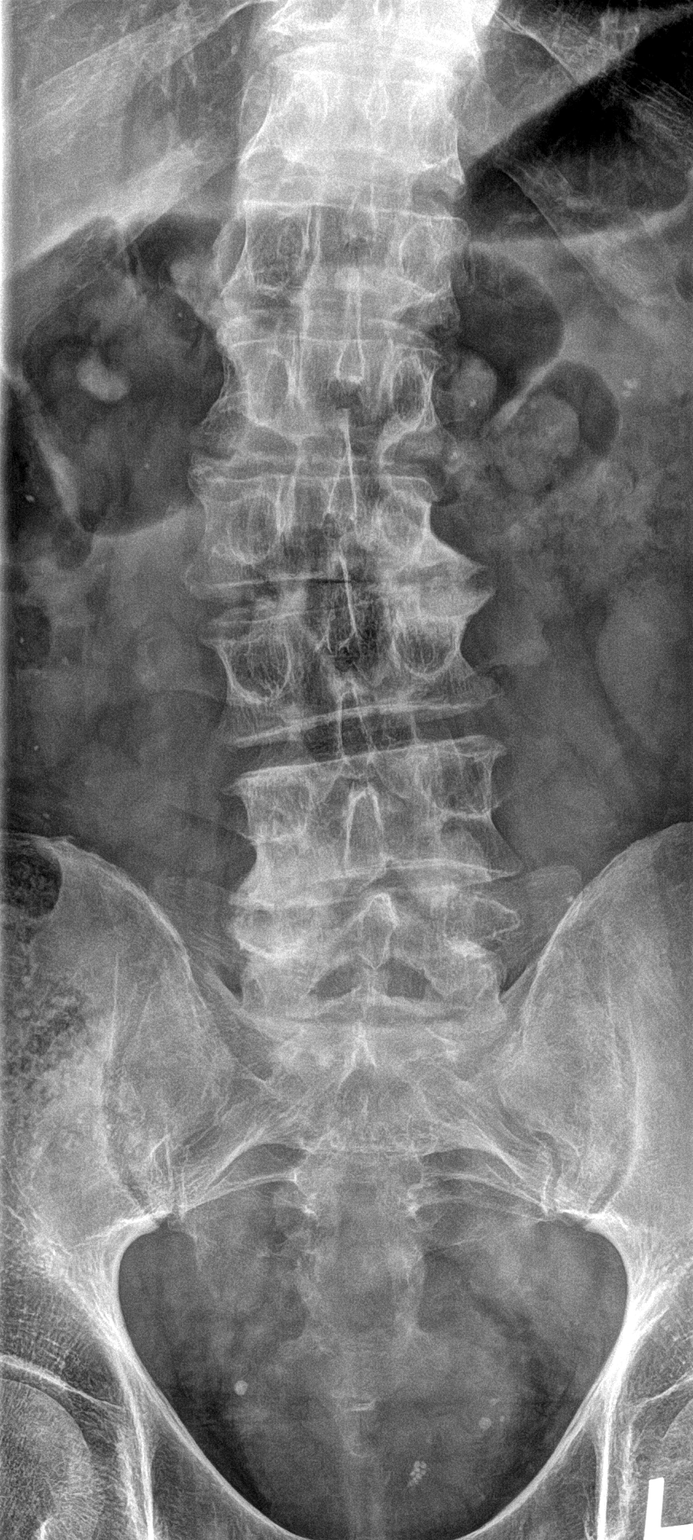

[l-spine lateral (1 of 2)]
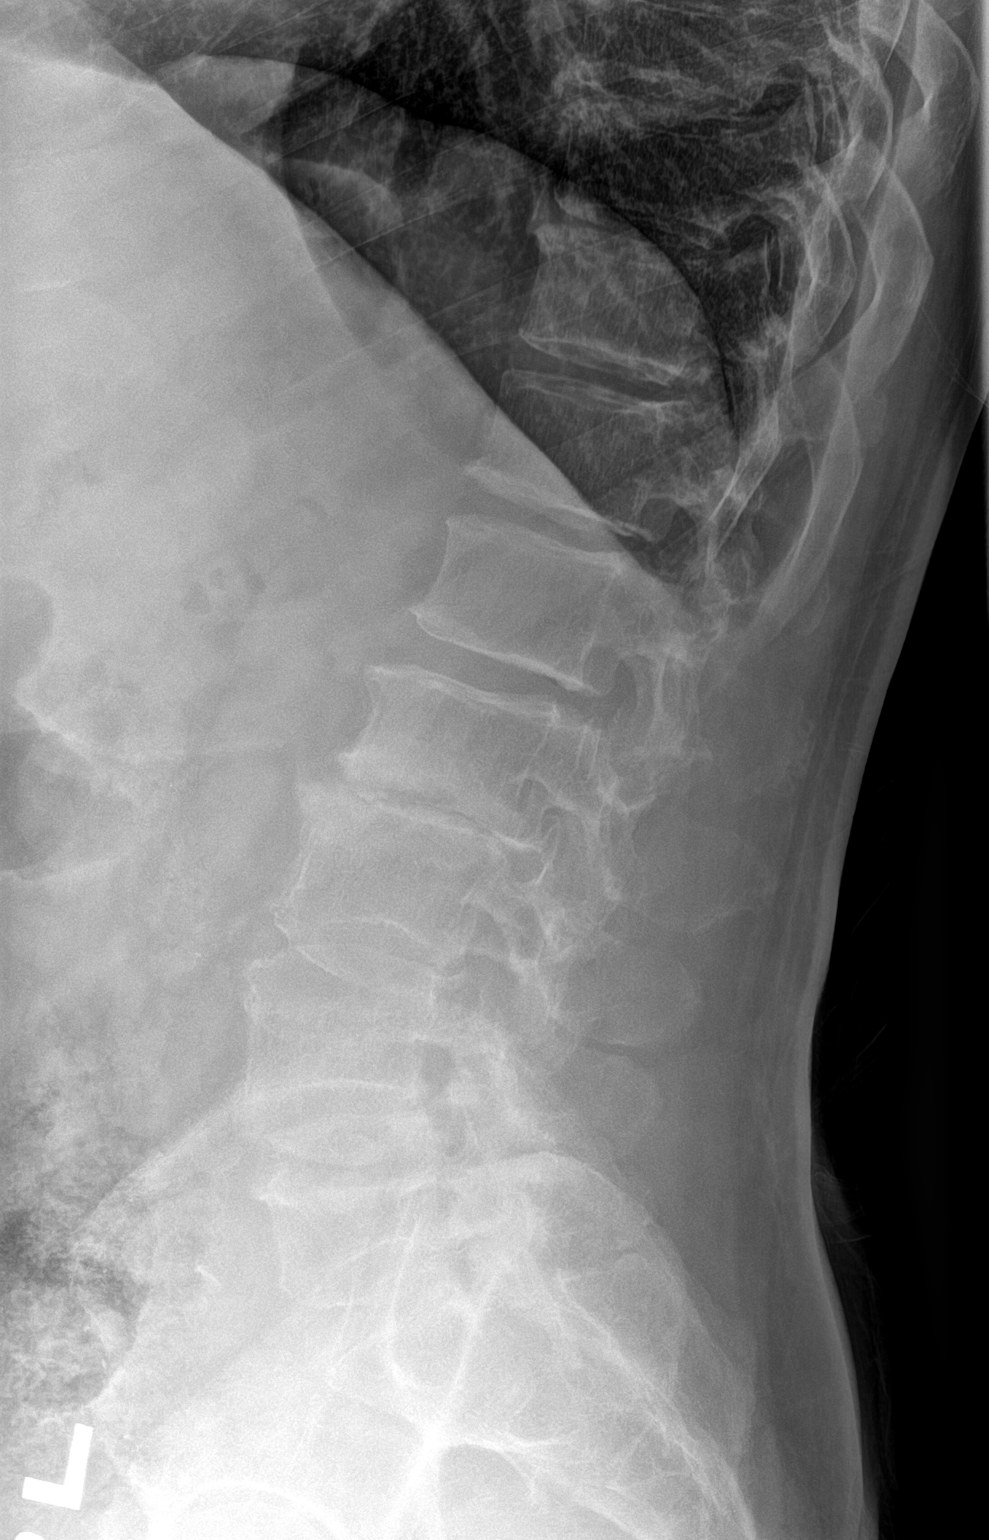

[l-spine lateral (2 of 2)]
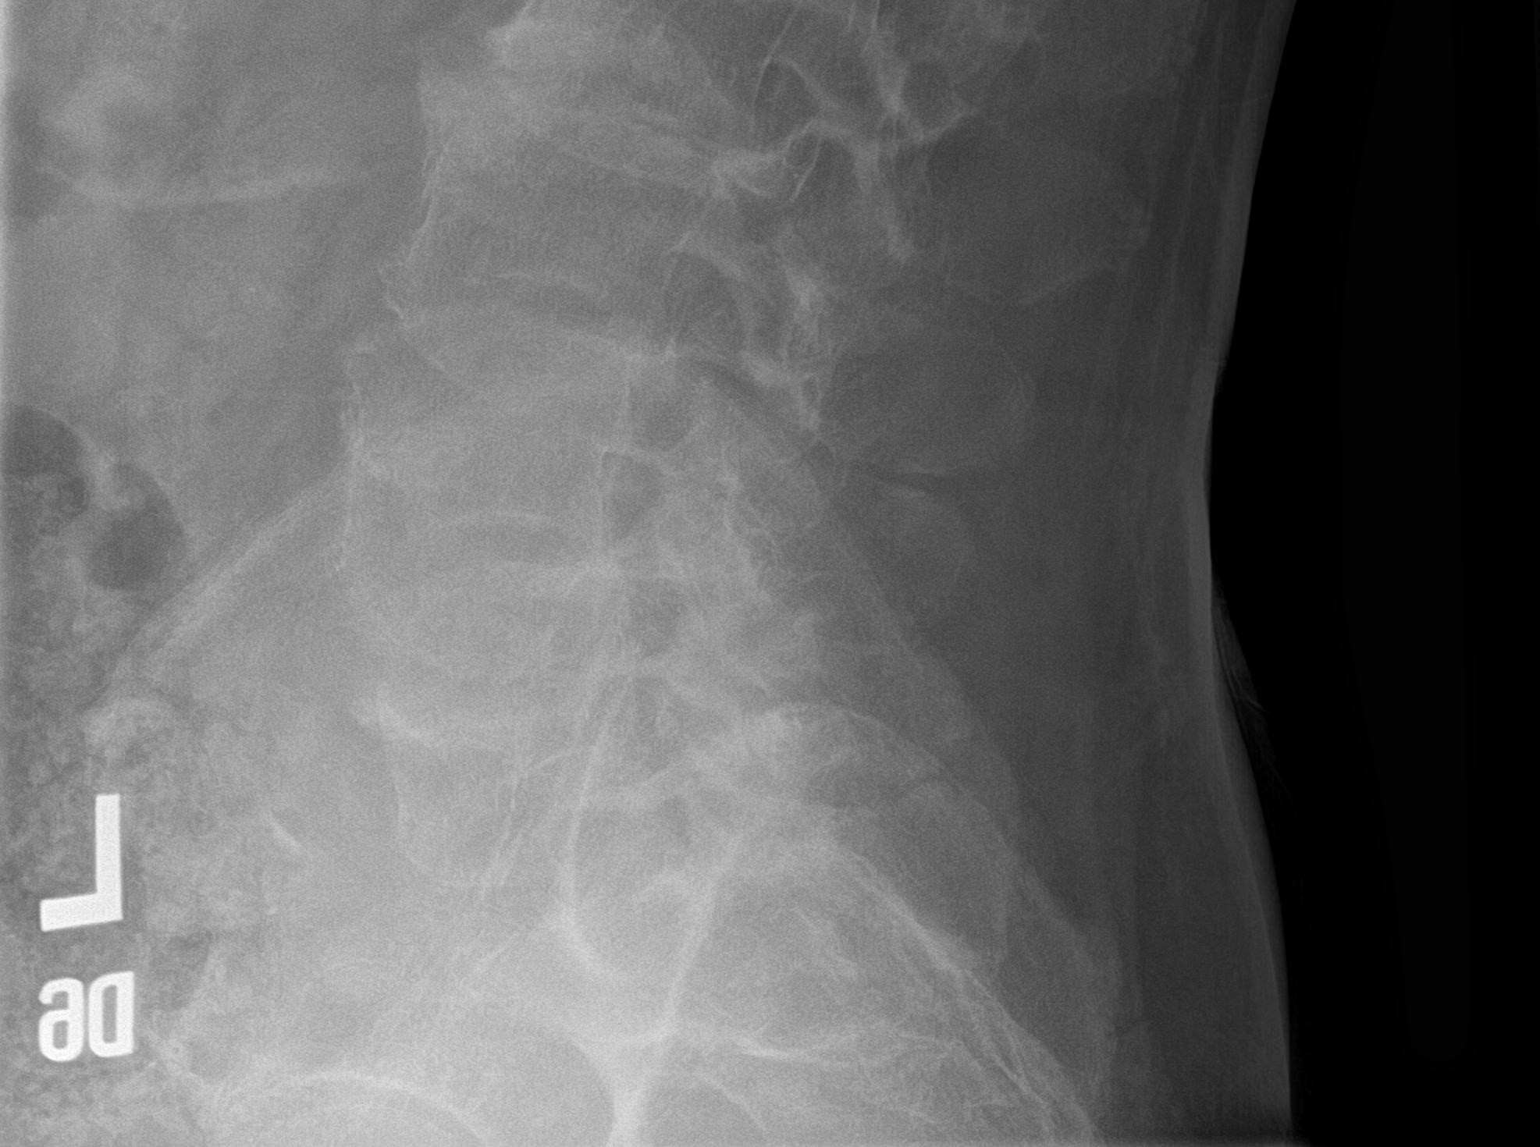

[3 of 3 positions shown; findings below may reference images not displayed]

FINDINGS: Osseous demineralization.

Hypoplastic last LEFT ribs.

Five non-rib-bearing lumbar vertebra.

Dextroconvex scoliosis with scattered disc space narrowing and
endplate spur formation greatest at L2-L3.

No fracture, subluxation, or bone destruction.

No spondylolysis.

Stable radiopacities within stool.

Cannot exclude small LEFT renal calculi up to 3 mm diameter.
IMPRESSION: Degenerative disc disease changes lumbar spine with dextroconvex
scoliosis and osseous demineralization.

No acute osseous findings.

Cannot exclude tiny LEFT renal calculi.

## 2022-07-06 DIAGNOSIS — H35372 Puckering of macula, left eye: Secondary | ICD-10-CM | POA: Diagnosis not present

## 2022-07-06 DIAGNOSIS — H52223 Regular astigmatism, bilateral: Secondary | ICD-10-CM | POA: Diagnosis not present

## 2022-07-12 DIAGNOSIS — L821 Other seborrheic keratosis: Secondary | ICD-10-CM | POA: Diagnosis not present

## 2022-07-12 DIAGNOSIS — Z08 Encounter for follow-up examination after completed treatment for malignant neoplasm: Secondary | ICD-10-CM | POA: Diagnosis not present

## 2022-07-12 DIAGNOSIS — D229 Melanocytic nevi, unspecified: Secondary | ICD-10-CM | POA: Diagnosis not present

## 2022-07-12 DIAGNOSIS — L814 Other melanin hyperpigmentation: Secondary | ICD-10-CM | POA: Diagnosis not present

## 2022-09-06 ENCOUNTER — Observation Stay (HOSPITAL_COMMUNITY): Payer: Medicare Other

## 2022-09-06 ENCOUNTER — Inpatient Hospital Stay (HOSPITAL_COMMUNITY)
Admission: EM | Admit: 2022-09-06 | Discharge: 2022-09-08 | DRG: 308 | Disposition: A | Discharge status: Home / Self Care | Payer: Medicare Other | Attending: Family Medicine | Admitting: Family Medicine

## 2022-09-06 ENCOUNTER — Emergency Department (HOSPITAL_COMMUNITY): Payer: Medicare Other

## 2022-09-06 ENCOUNTER — Ambulatory Visit
Admission: EM | Admit: 2022-09-06 | Discharge: 2022-09-06 | Disposition: A | Discharge status: Other Acute Inpatient Hospital | Payer: Medicare Other | Attending: Internal Medicine | Admitting: Internal Medicine

## 2022-09-06 ENCOUNTER — Encounter (HOSPITAL_COMMUNITY): Payer: Self-pay

## 2022-09-06 DIAGNOSIS — R053 Chronic cough: Secondary | ICD-10-CM | POA: Diagnosis not present

## 2022-09-06 DIAGNOSIS — R7401 Elevation of levels of liver transaminase levels: Secondary | ICD-10-CM | POA: Insufficient documentation

## 2022-09-06 DIAGNOSIS — I272 Pulmonary hypertension, unspecified: Secondary | ICD-10-CM | POA: Diagnosis not present

## 2022-09-06 DIAGNOSIS — Z8709 Personal history of other diseases of the respiratory system: Secondary | ICD-10-CM | POA: Diagnosis not present

## 2022-09-06 DIAGNOSIS — I5023 Acute on chronic systolic (congestive) heart failure: Secondary | ICD-10-CM | POA: Diagnosis not present

## 2022-09-06 DIAGNOSIS — I499 Cardiac arrhythmia, unspecified: Secondary | ICD-10-CM | POA: Diagnosis not present

## 2022-09-06 DIAGNOSIS — I4819 Other persistent atrial fibrillation: Secondary | ICD-10-CM | POA: Diagnosis not present

## 2022-09-06 DIAGNOSIS — I509 Heart failure, unspecified: Secondary | ICD-10-CM

## 2022-09-06 DIAGNOSIS — J42 Unspecified chronic bronchitis: Secondary | ICD-10-CM | POA: Insufficient documentation

## 2022-09-06 DIAGNOSIS — Z1152 Encounter for screening for COVID-19: Secondary | ICD-10-CM | POA: Diagnosis not present

## 2022-09-06 DIAGNOSIS — I5033 Acute on chronic diastolic (congestive) heart failure: Secondary | ICD-10-CM | POA: Diagnosis not present

## 2022-09-06 DIAGNOSIS — D649 Anemia, unspecified: Secondary | ICD-10-CM | POA: Diagnosis not present

## 2022-09-06 DIAGNOSIS — R000 Tachycardia, unspecified: Secondary | ICD-10-CM | POA: Diagnosis not present

## 2022-09-06 DIAGNOSIS — I110 Hypertensive heart disease with heart failure: Secondary | ICD-10-CM | POA: Diagnosis present

## 2022-09-06 DIAGNOSIS — I4510 Unspecified right bundle-branch block: Secondary | ICD-10-CM | POA: Diagnosis present

## 2022-09-06 DIAGNOSIS — J189 Pneumonia, unspecified organism: Secondary | ICD-10-CM | POA: Diagnosis not present

## 2022-09-06 DIAGNOSIS — I2510 Atherosclerotic heart disease of native coronary artery without angina pectoris: Secondary | ICD-10-CM | POA: Diagnosis not present

## 2022-09-06 DIAGNOSIS — I4891 Unspecified atrial fibrillation: Secondary | ICD-10-CM

## 2022-09-06 DIAGNOSIS — J209 Acute bronchitis, unspecified: Secondary | ICD-10-CM | POA: Diagnosis present

## 2022-09-06 DIAGNOSIS — M7989 Other specified soft tissue disorders: Secondary | ICD-10-CM | POA: Diagnosis not present

## 2022-09-06 DIAGNOSIS — I4892 Unspecified atrial flutter: Secondary | ICD-10-CM | POA: Diagnosis not present

## 2022-09-06 DIAGNOSIS — I484 Atypical atrial flutter: Secondary | ICD-10-CM | POA: Diagnosis not present

## 2022-09-06 DIAGNOSIS — I5021 Acute systolic (congestive) heart failure: Secondary | ICD-10-CM | POA: Diagnosis not present

## 2022-09-06 DIAGNOSIS — J208 Acute bronchitis due to other specified organisms: Secondary | ICD-10-CM | POA: Diagnosis not present

## 2022-09-06 DIAGNOSIS — R0689 Other abnormalities of breathing: Secondary | ICD-10-CM | POA: Diagnosis not present

## 2022-09-06 LAB — COMPREHENSIVE METABOLIC PANEL
ALT: 54 U/L — ABNORMAL HIGH (ref 0–44)
AST: 46 U/L — ABNORMAL HIGH (ref 15–41)
Albumin: 3.6 g/dL (ref 3.5–5.0)
Alkaline Phosphatase: 116 U/L (ref 38–126)
Anion gap: 8 (ref 5–15)
BUN: 13 mg/dL (ref 8–23)
CO2: 29 mmol/L (ref 22–32)
Calcium: 8.6 mg/dL — ABNORMAL LOW (ref 8.9–10.3)
Chloride: 100 mmol/L (ref 98–111)
Creatinine, Ser: 0.95 mg/dL (ref 0.61–1.24)
GFR, Estimated: >60 mL/min (ref >60)
Glucose, Bld: 107 mg/dL — ABNORMAL HIGH (ref 70–99)
Potassium: 4 mmol/L (ref 3.5–5.1)
Sodium: 137 mmol/L (ref 135–145)
Total Bilirubin: 1.3 mg/dL — ABNORMAL HIGH (ref 0.3–1.2)
Total Protein: 6.8 g/dL (ref 6.5–8.1)

## 2022-09-06 LAB — CBC WITH DIFFERENTIAL/PLATELET
Abs Immature Granulocytes: 0.02 10*3/uL (ref 0.00–0.07)
Basophils Absolute: 0 10*3/uL (ref 0.0–0.1)
Basophils Relative: 0 %
Eosinophils Absolute: 0.2 10*3/uL (ref 0.0–0.5)
Eosinophils Relative: 2 %
HCT: 36.2 % — ABNORMAL LOW (ref 39.0–52.0)
Hemoglobin: 12.1 g/dL — ABNORMAL LOW (ref 13.0–17.0)
Immature Granulocytes: 0 %
Lymphocytes Relative: 18 %
Lymphs Abs: 1.4 10*3/uL (ref 0.7–4.0)
MCH: 31.2 pg (ref 26.0–34.0)
MCHC: 33.4 g/dL (ref 30.0–36.0)
MCV: 93.3 fL (ref 80.0–100.0)
Monocytes Absolute: 0.8 10*3/uL (ref 0.1–1.0)
Monocytes Relative: 10 %
Neutro Abs: 5.3 10*3/uL (ref 1.7–7.7)
Neutrophils Relative %: 70 %
Platelets: 208 10*3/uL (ref 150–400)
RBC: 3.88 MIL/uL — ABNORMAL LOW (ref 4.22–5.81)
RDW: 14.1 % (ref 11.5–15.5)
WBC: 7.6 10*3/uL (ref 4.0–10.5)
nRBC: 0 % (ref 0.0–0.2)

## 2022-09-06 LAB — RESP PANEL BY RT-PCR (FLU A&B, COVID) ARPGX2
Influenza A by PCR: NEGATIVE
Influenza B by PCR: NEGATIVE
SARS Coronavirus 2 by RT PCR: NEGATIVE

## 2022-09-06 LAB — TROPONIN I (HIGH SENSITIVITY)
Troponin I (High Sensitivity): 8 ng/L (ref <18)
Troponin I (High Sensitivity): 6 ng/L (ref <18)

## 2022-09-06 LAB — PROTIME-INR
INR: 1.1 (ref 0.8–1.2)
Prothrombin Time: 14.3 seconds (ref 11.4–15.2)

## 2022-09-06 LAB — APTT: aPTT: 32 seconds (ref 24–36)

## 2022-09-06 LAB — MAGNESIUM: Magnesium: 2 mg/dL (ref 1.7–2.4)

## 2022-09-06 LAB — BRAIN NATRIURETIC PEPTIDE: B Natriuretic Peptide: 274.5 pg/mL — ABNORMAL HIGH (ref 0.0–100.0)

## 2022-09-06 MED ORDER — DILTIAZEM HCL-DEXTROSE 125-5 MG/125ML-% IV SOLN (PREMIX)
5.0000 mg/h | INTRAVENOUS | Status: DC
  Administered 2022-09-06 – 2022-09-07 (×2): 5 mg/h via INTRAVENOUS
  Administered 2022-09-07: 12.5 mg/h via INTRAVENOUS
  Administered 2022-09-08: 10 mg/h via INTRAVENOUS
  Filled 2022-09-06 (×4): qty 125

## 2022-09-06 MED ORDER — SODIUM CHLORIDE 0.9 % IV BOLUS
1000.0000 mL | Freq: Once | INTRAVENOUS | Status: AC
  Administered 2022-09-06: 1000 mL via INTRAVENOUS

## 2022-09-06 MED ORDER — HEPARIN BOLUS VIA INFUSION
4000.0000 [IU] | Freq: Once | INTRAVENOUS | Status: AC
  Administered 2022-09-06: 4000 [IU] via INTRAVENOUS
  Filled 2022-09-06: qty 4000

## 2022-09-06 MED ORDER — GUAIFENESIN-DM 100-10 MG/5ML PO SYRP
5.0000 mL | ORAL_SOLUTION | ORAL | Status: DC | PRN
  Administered 2022-09-07: 5 mL via ORAL
  Filled 2022-09-06: qty 5

## 2022-09-06 MED ORDER — HEPARIN (PORCINE) 25000 UT/250ML-% IV SOLN
1400.0000 [IU]/h | INTRAVENOUS | Status: DC
  Administered 2022-09-06: 1250 [IU]/h via INTRAVENOUS
  Administered 2022-09-07: 1400 [IU]/h via INTRAVENOUS
  Filled 2022-09-06 (×2): qty 250

## 2022-09-06 MED ORDER — DILTIAZEM HCL 25 MG/5ML IV SOLN
20.0000 mg | Freq: Once | INTRAVENOUS | Status: AC
  Administered 2022-09-06: 20 mg via INTRAVENOUS
  Filled 2022-09-06: qty 5

## 2022-09-06 NOTE — ED Notes (Signed)
Patient transported to CT 

## 2022-09-06 NOTE — Progress Notes (Signed)
ANTICOAGULATION CONSULT NOTE - Initial Consult  Pharmacy Consult for Heparin Indication: atrial fibrillation  No Known Allergies  Patient Measurements: Height: '5\' 7"'$  (170.2 cm) Weight: 81.6 kg (180 lb) IBW/kg (Calculated) : 66.1 Heparin Dosing Weight: TBW  Vital Signs: Temp: 98.5 F (36.9 C) (11/28 1937) Temp Source: Oral (11/28 1937) BP: 132/105 (11/28 2140) Pulse Rate: 147 (11/28 2140)  Labs: Recent Labs    09/06/22 1927  HGB 12.1*  HCT 36.2*  PLT 208  LABPROT 14.3  INR 1.1  CREATININE 0.95  TROPONINIHS 8    Estimated Creatinine Clearance: 72.9 mL/min (by C-G formula based on SCr of 0.95 mg/dL).   Medical History: History reviewed. No pertinent past medical history.  Medications:  No home meds  Assessment: 71 y.o. M presents with new onset afib. To begin heparin. No AC PTA. CBC ok on baseline.  Goal of Therapy:  Heparin level 0.3-0.7 units/ml Monitor platelets by anticoagulation protocol: Yes   Plan:  Heparin IV bolus 4000 units Heparin gtt at 1250 units/hr Will f/u heparin level in 8 hours Daily heparin level and CBC  Sherlon Handing, PharmD, BCPS Please see amion for complete clinical pharmacist phone list 09/06/2022,10:08 PM

## 2022-09-06 NOTE — ED Provider Notes (Signed)
[facility] EMERGENCY DEPARTMENT Provider Note   CSN: [id number] Arrival date & time: 09/06/22  1920     History  Chief Complaint  Stephen Cortez presents with   Tachycardia    Stephen Cortez is a 71 y.o. male. With no documented past medical history and infrequent medical contact who presents to the emergency department from urgent care for cough and atrial flutter.  States that Stephen Cortez went to urgent care today because Stephen Cortez has had cough and congestion for 7 weeks. States Stephen Cortez has had productive cough that has slowly been getting worse. States that Stephen Cortez felt like Stephen Cortez had left chest pain today that "felt like a pulled muscle", which prompted him to go to the ER. Stephen Cortez states last time Stephen Cortez had that sensation Stephen Cortez had pneumonia and was concerned. Stephen Cortez notes last night Stephen Cortez felt as though Stephen Cortez was having palpitations, but currently denies palpitations or shortness of breath. Denies fever, sore throat, lightheadedness, dizziness, syncope. Not anticoagulated.   Per UC note, was tachycardic so EKG was obtained showing atrial fibrillation vs flutter. Stephen Cortez was sent to the ER via EMS.   HPI     Home Medications Prior to Admission medications   Not on File      Allergies    Stephen Cortez has no known allergies.    Review of Systems   Review of Systems  Constitutional:  Negative for fever.  Respiratory:  Positive for cough. Negative for shortness of breath.   Cardiovascular:  Positive for chest pain and palpitations.  All other systems reviewed and are negative.   Physical Exam Updated Vital Signs BP (!) 132/105   Pulse (!) 147   Temp 98.5 F (36.9 C) (Oral)   Resp 19   Ht '5\' 7"'$  (1.702 m)   Wt 81.6 kg   SpO2 96%   BMI 28.19 kg/m  Physical Exam Vitals and nursing note reviewed.  Constitutional:      General: Stephen Cortez is in acute distress.     Appearance: Normal appearance. Stephen Cortez is ill-appearing. Stephen Cortez is not toxic-appearing or diaphoretic.  HENT:     Head: Normocephalic.     Mouth/Throat:      Mouth: Mucous membranes are dry.     Pharynx: Oropharynx is clear.  Eyes:     General: No scleral icterus.    Extraocular Movements: Extraocular movements intact.  Cardiovascular:     Rate and Rhythm: Tachycardia present. Rhythm irregularly irregular.     Pulses:          Radial pulses are 1+ on the right side and 1+ on the left side.     Heart sounds: No murmur heard.    Comments: L>R LE swelling  Pulmonary:     Effort: Pulmonary effort is normal. No tachypnea or respiratory distress.     Breath sounds: Examination of the left-lower field reveals rales. Rales present.  Abdominal:     General: Abdomen is protuberant. Bowel sounds are normal.     Palpations: Abdomen is soft.     Tenderness: There is no abdominal tenderness.  Musculoskeletal:     Cervical back: Neck supple.     Right lower leg: Edema present.     Left lower leg: 1+ Edema present.  Skin:    General: Skin is warm and dry.     Capillary Refill: Capillary refill takes less than 2 seconds.  Neurological:     General: No focal deficit present.     Mental Status: Stephen Cortez is alert.  Psychiatric:  Mood and Affect: Mood normal.        Behavior: Behavior normal.     ED Results / Procedures / Treatments   Labs (all labs ordered are listed, but only abnormal results are displayed) Labs Reviewed  COMPREHENSIVE METABOLIC PANEL - Abnormal; Notable for the following components:      Result Value   Glucose, Bld 107 (*)    Calcium 8.6 (*)    AST 46 (*)    ALT 54 (*)    Total Bilirubin 1.3 (*)    All other components within normal limits  CBC WITH DIFFERENTIAL/PLATELET - Abnormal; Notable for the following components:   RBC 3.88 (*)    Hemoglobin 12.1 (*)    HCT 36.2 (*)    All other components within normal limits  BRAIN NATRIURETIC PEPTIDE - Abnormal; Notable for the following components:   B Natriuretic Peptide 274.5 (*)    All other components within normal limits  RESP PANEL BY RT-PCR (FLU A&B, COVID) [id number]   PROTIME-INR  MAGNESIUM  APTT  TROPONIN I (HIGH SENSITIVITY)  TROPONIN I (HIGH SENSITIVITY)    EKG EKG Interpretation  Date/Time:  Tuesday September 06 2022 19:29:08 EST Ventricular Rate:  152 PR Interval:    QRS Duration: 81 QT Interval:  284 QTC Calculation: 452 R Axis:   92 Text Interpretation: Atrial fibrillation with rapid V-rate Ventricular premature complex Right axis deviation Borderline T abnormalities, diffuse leads Confirmed by Dene Gentry [phone]) on 09/06/2022 7:53:42 PM  Radiology DG Chest Port 1 View  Result Date: 09/06/2022 CLINICAL DATA:  Atrial fibrillation EXAM: PORTABLE CHEST 1 VIEW COMPARISON:  02/09/2021 FINDINGS: Mild cardiomegaly. Bronchitic changes which appear chronic. No acute airspace disease, pleural effusion or pneumothorax IMPRESSION: No active disease. Chronic bronchitic changes. Mild cardiomegaly. Electronically Signed   By: Donavan Foil M.D.   On: 09/06/2022 20:27    Procedures .Critical Care  Performed by: Mickie Hillier, PA-C Authorized by: Mickie Hillier, PA-C   Critical care provider statement:    Critical care time (minutes):  40   Critical care time was exclusive of:  Separately billable procedures and treating other patients   Critical care was necessary to treat or prevent imminent or life-threatening deterioration of the following conditions:  Cardiac failure   Critical care was time spent personally by me on the following activities:  Development of treatment plan with Stephen Cortez or surrogate, discussions with consultants, discussions with primary provider, evaluation of Stephen Cortez's response to treatment, examination of Stephen Cortez, interpretation of cardiac output measurements, obtaining history from Stephen Cortez or surrogate, review of old charts, re-evaluation of Stephen Cortez's condition, pulse oximetry, ordering and review of radiographic studies, ordering and review of laboratory studies and ordering and performing treatments and interventions   I  assumed direction of critical care for this Stephen Cortez from another provider in my specialty: no     Care discussed with: admitting provider       Medications Ordered in ED Medications  diltiazem (CARDIZEM) 125 mg in dextrose 5% 125 mL (1 mg/mL) infusion (10 mg/hr Intravenous Rate/Dose Change 09/06/22 2140)  sodium chloride 0.9 % bolus 1,000 mL (0 mLs Intravenous Stopped 09/06/22 2140)  diltiazem (CARDIZEM) injection 20 mg (20 mg Intravenous Given 09/06/22 1945)    ED Course/ Medical Decision Making/ A&P                           Medical Decision Making Amount and/or Complexity of Data Reviewed Labs: ordered. Radiology:  ordered.  Risk Prescription drug management. Decision regarding hospitalization.  Initial Impression and Ddx 71 year old male who presents to the emergency department from urgent care for new onset afib with RVR. On my initial exam, stable male. Non-septic, non-toxic in appearance. Hemodynamically stable with afib rvr rate ~140-150s. Mentating well. Stephen Cortez is not symptomatic.  Stephen Cortez PMH that increases complexity of ED encounter:  none documented. Ddx: sepsis or shock, toxidrome, underlying CHF, COPD. Has history of significant alcohol use which may contribute, etc Will obtain labs including troponin, bnp, coags with anticipation of anticoagulation and heparin gtt.  Cxr, EKG, will given '20mg'$  IV cardizem push and reassess.   Interpretation of Diagnostics I independent reviewed and interpreted the labs as followed: CMP with mildly elevated transaminases, history of alcohol use.  The pattern of transaminitis is not consistent with this, however may be related.  CBC with mild anemia.  Magnesium normal.  Troponin x 1 negative, delta pending.  BNP mildly elevated at 274.  - I independently visualized the following imaging with scope of interpretation limited to determining acute life threatening conditions related to emergency care: Chest x-ray, which revealed no evidence of  pneumonia, pleural effusion, pneumothorax  Stephen Cortez Reassessment and Ultimate Disposition/Management Reassessment after '20mg'$  IV cardizem. Stephen Cortez initial had significant improvement in rate around 100-110. Reassessed about 15 minutes after that with increased HR back up to 120-140, so placed orders for cardizem gtt.  On reassessment after Cardizem drip Stephen Cortez's heart rate in the low 100s.  When I go talk with him it does increase back up to the 130s. Labs as above This is new onset atrial fibrillation with rapid ventricular response.  Stephen Cortez initially had transient response to a 20 mg IV Cardizem push and return to rapid A-fib.  Stephen Cortez was then placed on a Cardizem drip.  His troponin is negative so doubt ACS and his EKG does not appear to have any ischemic changes.  Stephen Cortez is not overtly anemic, septic to precipitate A-fib with RVR.  Stephen Cortez does appear mildly fluid overloaded as Stephen Cortez has lower extremity edema.  His BNP is not elevated enough for fulminant heart failure.  Stephen Cortez has little medical contact so unclear if Stephen Cortez has any underlying COPD to contribute. Stephen Cortez does have history of alcohol use.  Stephen Cortez discussed with the attending that Stephen Cortez previously used to drink 1-2 sixpacks a day many years ago.  This may be underlying cause for his new onset A-fib. Stephen Cortez will need admission, anticoagulation and cardiac workup.  I discussed this with him and Stephen Cortez verbalizes understanding.  Consulted and spoke with Dr. Marlowe Sax, hospitalist who agrees to admit the Stephen Cortez.  Stephen Cortez management required discussion with the following services or consulting groups:  Hospitalist Service  Complexity of Problems Addressed Acute illness or injury that poses threat of life of bodily function  Additional Data Reviewed and Analyzed Further history obtained from: EMS on arrival, Further history from spouse/family member, Prior ED visit notes, and Care Everywhere  Stephen Cortez Encounter Risk Assessment Prescriptions and Consideration of hospitalization  Final  Clinical Impression(s) / ED Diagnoses Final diagnoses:  Atrial fibrillation with RVR [facility])    Rx / DC Orders ED Discharge Orders     None         Mickie Hillier, PA-C 09/06/22 2143    Valarie Merino, MD 09/07/22 0002

## 2022-09-06 NOTE — H&P (Signed)
History and Physical    Stephen Cortez VQM:[id number] DOB: 11/23/1950 DOA: 09/06/2022  PCP: Horald Pollen, MD  Patient coming from: Home  Chief Complaint: Cough  HPI: Stephen Cortez is a 71 y.o. male with medical history significant of pneumonia, back pain seen urgent care today for cough and nasal congestion.  Noted to be tachycardic and EKG concerning for A-fib versus flutter and sent to the ED for further evaluation.  Rate in the 130s to 180s with EMS and was given Cardizem 10 mg x 2 during transport.  On arrival to the ED, noted to be in A-fib with RVR with rate in the 140s to 150s.  Labs showing no leukocytosis, hemoglobin 12.1, MCV 93.3, AST 46, ALT 54, alk phos 116, T. bili 1.3, troponin negative, COVID and influenza PCR negative, magnesium 2.0, BNP 274.  Chest x-ray showing chronic bronchitic changes, mild cardiomegaly, and no active disease. Patient was given IV Cardizem 20 mg and started on continuous infusion.  He is also given 1 L normal saline bolus.  TRH called to admit.  Patient reports 7 to 8-week history of cough and congestion.  For the past 1 day he has noticed that his left leg is swollen and his heart is racing.  He denies history of A-fib or any cardiac problems.  Denies history of recent travel, surgeries, or blood clots.  Denies chest pain.  Denies fevers, vomiting, diarrhea, or abdominal pain.  Denies alcohol use.  Review of Systems:  Review of Systems  All other systems reviewed and are negative.   History reviewed. No pertinent past medical history.  Past Surgical History:  Procedure Laterality Date   MINOR CARPAL TUNNEL       reports that he has never smoked. He has never used smokeless tobacco. He reports that he does not currently use alcohol. He reports that he does not use drugs.  No Known Allergies  History reviewed. No pertinent family history.  Prior to Admission medications   Medication Sig Start Date End Date Taking? Authorizing  Provider  cephALEXin (KEFLEX) 500 MG capsule Take 1 capsule (500 mg total) by mouth 3 (three) times daily. Patient not taking: Reported on 09/06/2022 06/03/22   Raspet, Derry Skill, PA-C  mupirocin ointment (BACTROBAN) 2 % Apply 1 Application topically 2 (two) times daily. 06/03/22   RaspetDerry Skill, PA-C    Physical Exam: Vitals:   09/06/22 1945 09/06/22 2000 09/06/22 2015 09/06/22 2030  BP: (!) 124/95 107/88 (!) 122/90 111/88  Pulse: (!) 147 66 (!) 101 (!) 146  Resp: _0 Temp:      TempSrc:      SpO2: 95% 96% 95% 98%  Weight:      Height:        Physical Exam Vitals reviewed.  Constitutional:      General: He is not in acute distress. HENT:     Head: Normocephalic and atraumatic.  Eyes:     Extraocular Movements: Extraocular movements intact.  Cardiovascular:     Rate and Rhythm: Tachycardia present. Rhythm irregular.     Pulses: Normal pulses.  Pulmonary:     Effort: Pulmonary effort is normal. No respiratory distress.     Breath sounds: Normal breath sounds. No wheezing or rales.  Abdominal:     General: Bowel sounds are normal. There is no distension.     Palpations: Abdomen is soft.     Tenderness: There is no abdominal tenderness.  Musculoskeletal:  Cervical back: Normal range of motion.     Right lower leg: No edema.     Left lower leg: Edema present.     Comments: Left lower extremity warm to touch, edematous, and slightly erythematous.  Skin:    General: Skin is warm and dry.  Neurological:     General: No focal deficit present.     Mental Status: He is alert and oriented to person, place, and time.     Labs on Admission: I have personally reviewed following labs and imaging studies  CBC: Recent Labs  Lab 09/06/22 1927  WBC 7.6  NEUTROABS 5.3  HGB 12.1*  HCT 36.2*  MCV 93.3  PLT 710   Basic Metabolic Panel: Recent Labs  Lab 09/06/22 1927  NA 137  K 4.0  CL 100  CO2 29  GLUCOSE 107*  BUN 13  CREATININE 0.95  CALCIUM 8.6*  MG 2.0    GFR: Estimated Creatinine Clearance: 72.9 mL/min (by C-G formula based on SCr of 0.95 mg/dL). Liver Function Tests: Recent Labs  Lab 09/06/22 1927  AST 46*  ALT 54*  ALKPHOS 116  BILITOT 1.3*  PROT 6.8  ALBUMIN 3.6   No results for input(s): "LIPASE", "AMYLASE" in the last 168 hours. No results for input(s): "AMMONIA" in the last 168 hours. Coagulation Profile: Recent Labs  Lab 09/06/22 1927  INR 1.1   Cardiac Enzymes: No results for input(s): "CKTOTAL", "CKMB", "CKMBINDEX", "TROPONINI" in the last 168 hours. BNP (last 3 results) No results for input(s): "PROBNP" in the last 8760 hours. HbA1C: No results for input(s): "HGBA1C" in the last 72 hours. CBG: No results for input(s): "GLUCAP" in the last 168 hours. Lipid Profile: No results for input(s): "CHOL", "HDL", "LDLCALC", "TRIG", "CHOLHDL", "LDLDIRECT" in the last 72 hours. Thyroid Function Tests: No results for input(s): "TSH", "T4TOTAL", "FREET4", "T3FREE", "THYROIDAB" in the last 72 hours. Anemia Panel: No results for input(s): "VITAMINB12", "FOLATE", "FERRITIN", "TIBC", "IRON", "RETICCTPCT" in the last 72 hours. Urine analysis: No results found for: "COLORURINE", "APPEARANCEUR", "LABSPEC", "PHURINE", "GLUCOSEU", "HGBUR", "BILIRUBINUR", "KETONESUR", "PROTEINUR", "UROBILINOGEN", "NITRITE", "LEUKOCYTESUR"  Radiological Exams on Admission: DG Chest Port 1 View  Result Date: 09/06/2022 CLINICAL DATA:  Atrial fibrillation EXAM: PORTABLE CHEST 1 VIEW COMPARISON:  02/09/2021 FINDINGS: Mild cardiomegaly. Bronchitic changes which appear chronic. No acute airspace disease, pleural effusion or pneumothorax IMPRESSION: No active disease. Chronic bronchitic changes. Mild cardiomegaly. Electronically Signed   By: Donavan Foil M.D.   On: 09/06/2022 20:27    EKG: Independently reviewed.  A-fib with RVR, no prior tracing for comparison.  Assessment and Plan  New onset A-fib with RVR Rate remains elevated despite multiple  doses of IV Cardizem and currently on continuous Cardizem infusion.  Rate currently in the 140s.  Magnesium is normal.  Patient denies alcohol use.  No infectious signs or symptoms.  PE could be a possible precipitating factor as he does have unilateral lower extremity edema/warmth/erythema. -Cardiac monitoring -Continue Cardizem infusion -Start IV heparin -Stat CTA chest to rule out PE.  Also left lower extremity Doppler ordered. -Check TSH -Echocardiogram -Discussed with cardiology  ?CHF No documented history of CHF.  BNP 274.  Chest x-ray showing mild cardiomegaly but no pulmonary edema.  ?PE as a cause of elevated BNP given persistent tachycardia and physical exam findings concerning for DVT. -Cardiac monitoring -Echocardiogram  Chronic bronchitis Patient is reporting cough and congestion for the past 7 or 8 weeks.  COVID and influenza PCR negative.  Chest x-ray showing chronic bronchitic changes.  He is not wheezing. -Antitussive as needed  Mild normocytic anemia Patient is not endorsing any symptoms of GI bleed.  No prior labs for comparison. -Continue to monitor  Mild transaminitis Patient is not endorsing any GI symptoms.  Denies alcohol use. -Repeat LFTs in a.m., pursue further workup if not improving  DVT prophylaxis: IV heparin gtt Code Status: Full Code Family Communication: Patient's daughter and wife at bedside. Level of care: Progressive Care Unit Admission status: It is my clinical opinion that referral for OBSERVATION is reasonable and necessary in this patient based on the above information provided. The aforementioned taken together are felt to place the patient at high risk for further clinical deterioration. However, it is anticipated that the patient may be medically stable for discharge from the hospital within 24 to 48 hours.   Shela Leff MD [facility]  If 7PM-7AM, please contact night-coverage [email or web address]  09/06/2022, 9:20 PM

## 2022-09-06 NOTE — ED Notes (Signed)
Patient is being discharged from the Urgent Care and sent to the Emergency Department via ems . Per [facility] np, patient is in need of higher level of care due to a.flutter/a.fib. Patient is aware and verbalizes understanding of plan of care.  Vitals:   09/06/22 1758  BP: 122/86  Pulse: (!) 115  Resp: 16  Temp: 98.2 F (36.8 C)  SpO2: 97%

## 2022-09-06 NOTE — Discharge Instructions (Addendum)
Patient sent to hospital via EMS.  

## 2022-09-06 NOTE — ED Triage Notes (Signed)
BIBA from UC with c/o "twinge in chest" since last night. EKG found him in AFIB RVR 130-180s with EMS. No prior h/o AFIB.  Cardizem '10mg'$  X2 given en route without success.    EMSVS: 120/80  130 HR 20G R FA

## 2022-09-06 NOTE — ED Triage Notes (Signed)
Pt presents to uc with co of congestion and cough  for 7 weeks. Pt is concerned for pneumonia as he has had it before and st it feels similar. Wife is also sick and has been on prednisone. Pt has been taking otc cough medication for symptoms

## 2022-09-06 NOTE — ED Provider Notes (Signed)
[facility]    CSN: [id number] Arrival date & time: 09/06/22  1409      History   Chief Complaint Chief Complaint  Patient presents with   Nasal Congestion    HPI Stephen Cortez is a 71 y.o. male.   Patient presents with persistent cough and nasal congestion that has been present for 7 weeks.  Wife has similar symptoms.  Patient denies chest pain, shortness of breath, palpitations, any fevers.  Patient reports that he has a history of pneumonia and reports that this "feels similar".  He has taken several over-the-counter cough and cold medication with minimal improvement in symptoms.  Denies any formal diagnosis of asthma or COPD and patient does not smoke cigarettes.     History reviewed. No pertinent past medical history.  Patient Active Problem List   Diagnosis Date Noted   Lumbar pain 02/09/2021   History of recent pneumonia 02/09/2021    History reviewed. No pertinent surgical history.     Home Medications    Prior to Admission medications   Medication Sig Start Date End Date Taking? Authorizing Provider  cephALEXin (KEFLEX) 500 MG capsule Take 1 capsule (500 mg total) by mouth 3 (three) times daily. Patient not taking: Reported on 09/06/2022 06/03/22   Raspet, Derry Skill, PA-C  mupirocin ointment (BACTROBAN) 2 % Apply 1 Application topically 2 (two) times daily. 06/03/22   Raspet, Derry Skill, PA-C    Family History History reviewed. No pertinent family history.  Social History Social History   Tobacco Use   Smoking status: Never   Smokeless tobacco: Never     Allergies   Patient has no known allergies.   Review of Systems Review of Systems Per HPI  Physical Exam Triage Vital Signs ED Triage Vitals  Enc Vitals Group     BP 09/06/22 1758 122/86     Pulse Rate 09/06/22 1758 (!) 115     Resp 09/06/22 1758 16     Temp 09/06/22 1758 98.2 F (36.8 C)     Temp src --      SpO2 09/06/22 1758 97 %     Weight --      Height --      Head  Circumference --      Peak Flow --      Pain Score 09/06/22 1757 4     Pain Loc --      Pain Edu? --      Excl. in [place]? --    No data found.  Updated Vital Signs BP 122/86   Pulse (!) 115   Temp 98.2 F (36.8 C)   Resp 16   SpO2 97%   Visual Acuity Right Eye Distance:   Left Eye Distance:   Bilateral Distance:    Right Eye Near:   Left Eye Near:    Bilateral Near:     Physical Exam Constitutional:      General: He is not in acute distress.    Appearance: Normal appearance. He is not toxic-appearing or diaphoretic.  HENT:     Head: Normocephalic and atraumatic.     Right Ear: Tympanic membrane and ear canal normal.     Left Ear: Tympanic membrane and ear canal normal.     Nose: Congestion present.     Mouth/Throat:     Mouth: Mucous membranes are moist.     Pharynx: No posterior oropharyngeal erythema.  Eyes:     Extraocular Movements: Extraocular movements intact.  Conjunctiva/sclera: Conjunctivae normal.     Pupils: Pupils are equal, round, and reactive to light.  Cardiovascular:     Rate and Rhythm: Tachycardia present. Rhythm irregular.     Pulses: Normal pulses.     Heart sounds: Normal heart sounds.  Pulmonary:     Effort: Pulmonary effort is normal. No respiratory distress.     Breath sounds: No stridor. Rhonchi present. No wheezing or rales.  Abdominal:     General: Abdomen is flat. Bowel sounds are normal.     Palpations: Abdomen is soft.  Musculoskeletal:        General: Normal range of motion.     Cervical back: Normal range of motion.  Skin:    General: Skin is warm and dry.  Neurological:     General: No focal deficit present.     Mental Status: He is alert and oriented to person, place, and time. Mental status is at baseline.  Psychiatric:        Mood and Affect: Mood normal.        Behavior: Behavior normal.      UC Treatments / Results  Labs (all labs ordered are listed, but only abnormal results are displayed) Labs Reviewed - No  data to display  EKG   Radiology No results found.  Procedures Procedures (including critical care time)  Medications Ordered in UC Medications - No data to display  Initial Impression / Assessment and Plan / UC Course  I have reviewed the triage vital signs and the nursing notes.  Pertinent labs & imaging results that were available during my care of the patient were reviewed by me and considered in my medical decision making (see chart for details).     Tachycardia noted on triage and on physical exam.  EKG was completed that showed atrial fibrillation versus atrial flutter.  Looks more like atrial flutter to me.  Patient does not have a history of this.  Given this, patient was advised to go to the ER for further evaluation and management and was agreeable with plan.  Suggested EMS transport and patient was agreeable.  Patient left via EMS transport.  Will defer evaluation of cough and congestion to ER given priority of new onset atrial flutter. Final Clinical Impressions(s) / UC Diagnoses   Final diagnoses:  Persistent cough  Atrial flutter, unspecified type [facility])     Discharge Instructions      Patient sent to hospital via EMS.     ED Prescriptions   None    PDMP not reviewed this encounter.   Teodora Medici, [place] 09/06/22 [phone]

## 2022-09-07 ENCOUNTER — Observation Stay (HOSPITAL_BASED_OUTPATIENT_CLINIC_OR_DEPARTMENT_OTHER): Payer: Medicare Other

## 2022-09-07 ENCOUNTER — Observation Stay (HOSPITAL_COMMUNITY): Payer: Medicare Other

## 2022-09-07 ENCOUNTER — Other Ambulatory Visit: Payer: Self-pay

## 2022-09-07 DIAGNOSIS — I4819 Other persistent atrial fibrillation: Secondary | ICD-10-CM | POA: Diagnosis present

## 2022-09-07 DIAGNOSIS — M7989 Other specified soft tissue disorders: Secondary | ICD-10-CM | POA: Diagnosis not present

## 2022-09-07 DIAGNOSIS — Z8709 Personal history of other diseases of the respiratory system: Secondary | ICD-10-CM | POA: Diagnosis not present

## 2022-09-07 DIAGNOSIS — I2510 Atherosclerotic heart disease of native coronary artery without angina pectoris: Secondary | ICD-10-CM | POA: Diagnosis present

## 2022-09-07 DIAGNOSIS — I4891 Unspecified atrial fibrillation: Secondary | ICD-10-CM | POA: Diagnosis not present

## 2022-09-07 DIAGNOSIS — I5023 Acute on chronic systolic (congestive) heart failure: Secondary | ICD-10-CM | POA: Diagnosis present

## 2022-09-07 DIAGNOSIS — Z1152 Encounter for screening for COVID-19: Secondary | ICD-10-CM | POA: Diagnosis not present

## 2022-09-07 DIAGNOSIS — I110 Hypertensive heart disease with heart failure: Secondary | ICD-10-CM | POA: Diagnosis present

## 2022-09-07 DIAGNOSIS — I484 Atypical atrial flutter: Secondary | ICD-10-CM | POA: Diagnosis present

## 2022-09-07 DIAGNOSIS — D649 Anemia, unspecified: Secondary | ICD-10-CM | POA: Diagnosis present

## 2022-09-07 DIAGNOSIS — I272 Pulmonary hypertension, unspecified: Secondary | ICD-10-CM | POA: Diagnosis present

## 2022-09-07 DIAGNOSIS — J209 Acute bronchitis, unspecified: Secondary | ICD-10-CM | POA: Diagnosis present

## 2022-09-07 DIAGNOSIS — I5021 Acute systolic (congestive) heart failure: Secondary | ICD-10-CM | POA: Diagnosis not present

## 2022-09-07 DIAGNOSIS — I4510 Unspecified right bundle-branch block: Secondary | ICD-10-CM | POA: Diagnosis present

## 2022-09-07 LAB — CBC
HCT: 32.6 % — ABNORMAL LOW (ref 39.0–52.0)
Hemoglobin: 10.9 g/dL — ABNORMAL LOW (ref 13.0–17.0)
MCH: 31.1 pg (ref 26.0–34.0)
MCHC: 33.4 g/dL (ref 30.0–36.0)
MCV: 92.9 fL (ref 80.0–100.0)
Platelets: 213 10*3/uL (ref 150–400)
RBC: 3.51 MIL/uL — ABNORMAL LOW (ref 4.22–5.81)
RDW: 14.4 % (ref 11.5–15.5)
WBC: 6.9 10*3/uL (ref 4.0–10.5)
nRBC: 0 % (ref 0.0–0.2)

## 2022-09-07 LAB — COMPREHENSIVE METABOLIC PANEL
ALT: 49 U/L — ABNORMAL HIGH (ref 0–44)
AST: 42 U/L — ABNORMAL HIGH (ref 15–41)
Albumin: 3.1 g/dL — ABNORMAL LOW (ref 3.5–5.0)
Alkaline Phosphatase: 108 U/L (ref 38–126)
Anion gap: 10 (ref 5–15)
BUN: 10 mg/dL (ref 8–23)
CO2: 26 mmol/L (ref 22–32)
Calcium: 8.3 mg/dL — ABNORMAL LOW (ref 8.9–10.3)
Chloride: 98 mmol/L (ref 98–111)
Creatinine, Ser: 0.87 mg/dL (ref 0.61–1.24)
GFR, Estimated: >60 mL/min (ref >60)
Glucose, Bld: 106 mg/dL — ABNORMAL HIGH (ref 70–99)
Potassium: 3.8 mmol/L (ref 3.5–5.1)
Sodium: 134 mmol/L — ABNORMAL LOW (ref 135–145)
Total Bilirubin: 0.9 mg/dL (ref 0.3–1.2)
Total Protein: 6.3 g/dL — ABNORMAL LOW (ref 6.5–8.1)

## 2022-09-07 LAB — ECHOCARDIOGRAM COMPLETE
Calc EF: 40.7 %
Height: 67 in
S' Lateral: 4.2 cm
Single Plane A2C EF: 43.6 %
Single Plane A4C EF: 36.4 %
Weight: 2880 oz

## 2022-09-07 LAB — LIPID PANEL
Cholesterol: 162 mg/dL (ref 0–200)
HDL: 64 mg/dL (ref >40)
LDL Cholesterol: 91 mg/dL (ref 0–99)
Total CHOL/HDL Ratio: 2.5 RATIO
Triglycerides: 35 mg/dL (ref <150)
VLDL: 7 mg/dL (ref 0–40)

## 2022-09-07 LAB — HEPARIN LEVEL (UNFRACTIONATED)
Heparin Unfractionated: 0.24 IU/mL — ABNORMAL LOW (ref 0.30–0.70)
Heparin Unfractionated: 0.29 IU/mL — ABNORMAL LOW (ref 0.30–0.70)

## 2022-09-07 LAB — TSH: TSH: 0.795 u[IU]/mL (ref 0.350–4.500)

## 2022-09-07 MED ORDER — SOTALOL HCL 120 MG PO TABS
120.0000 mg | ORAL_TABLET | Freq: Two times a day (BID) | ORAL | Status: DC
  Administered 2022-09-08: 120 mg via ORAL
  Filled 2022-09-07: qty 1

## 2022-09-07 MED ORDER — IOHEXOL 350 MG/ML SOLN
75.0000 mL | Freq: Once | INTRAVENOUS | Status: AC | PRN
  Administered 2022-09-07: 75 mL via INTRAVENOUS

## 2022-09-07 MED ORDER — MAGNESIUM CHLORIDE 64 MG PO TBEC
2.0000 | DELAYED_RELEASE_TABLET | Freq: Once | ORAL | Status: AC
  Administered 2022-09-07: 128 mg via ORAL
  Filled 2022-09-07: qty 2

## 2022-09-07 MED ORDER — TAMSULOSIN HCL 0.4 MG PO CAPS
0.4000 mg | ORAL_CAPSULE | Freq: Every day | ORAL | Status: DC
  Administered 2022-09-07 – 2022-09-08 (×2): 0.4 mg via ORAL
  Filled 2022-09-07 (×3): qty 1

## 2022-09-07 MED ORDER — GUAIFENESIN ER 600 MG PO TB12
600.0000 mg | ORAL_TABLET | Freq: Two times a day (BID) | ORAL | Status: DC | PRN
  Administered 2022-09-07 – 2022-09-08 (×2): 600 mg via ORAL
  Filled 2022-09-07 (×2): qty 1

## 2022-09-07 MED ORDER — METOPROLOL TARTRATE 25 MG PO TABS
25.0000 mg | ORAL_TABLET | Freq: Two times a day (BID) | ORAL | Status: DC
  Administered 2022-09-07: 25 mg via ORAL
  Filled 2022-09-07: qty 1

## 2022-09-07 MED ORDER — APIXABAN 5 MG PO TABS
5.0000 mg | ORAL_TABLET | Freq: Two times a day (BID) | ORAL | Status: DC
  Administered 2022-09-07 – 2022-09-08 (×3): 5 mg via ORAL
  Filled 2022-09-07 (×3): qty 1

## 2022-09-07 MED ORDER — HYDROCOD POLI-CHLORPHE POLI ER 10-8 MG/5ML PO SUER
5.0000 mL | Freq: Two times a day (BID) | ORAL | Status: DC | PRN
  Administered 2022-09-07 – 2022-09-08 (×2): 5 mL via ORAL
  Filled 2022-09-07 (×2): qty 5

## 2022-09-07 MED ORDER — BENZONATATE 100 MG PO CAPS
200.0000 mg | ORAL_CAPSULE | Freq: Three times a day (TID) | ORAL | Status: DC | PRN
  Administered 2022-09-07 – 2022-09-08 (×2): 200 mg via ORAL
  Filled 2022-09-07 (×2): qty 2

## 2022-09-07 MED ORDER — SOTALOL HCL 80 MG PO TABS
80.0000 mg | ORAL_TABLET | Freq: Two times a day (BID) | ORAL | Status: DC
  Administered 2022-09-07: 80 mg via ORAL
  Filled 2022-09-07: qty 1

## 2022-09-07 MED ORDER — METOPROLOL TARTRATE 5 MG/5ML IV SOLN: 5.0000 mg | INTRAVENOUS | Status: DC | PRN

## 2022-09-07 MED ORDER — POTASSIUM CHLORIDE CRYS ER 10 MEQ PO TBCR
10.0000 meq | EXTENDED_RELEASE_TABLET | ORAL | Status: AC
  Administered 2022-09-07 (×3): 10 meq via ORAL
  Filled 2022-09-07 (×3): qty 1

## 2022-09-07 MED ORDER — LEVALBUTEROL HCL 0.63 MG/3ML IN NEBU
0.6300 mg | INHALATION_SOLUTION | Freq: Four times a day (QID) | RESPIRATORY_TRACT | Status: DC | PRN
  Administered 2022-09-07: 0.63 mg via RESPIRATORY_TRACT
  Filled 2022-09-07: qty 3

## 2022-09-07 NOTE — Progress Notes (Signed)
ANTICOAGULATION CONSULT NOTE  Pharmacy Consult for Heparin>>apixaban Indication: atrial fibrillation  No Known Allergies  Patient Measurements: Height: '5\' 7"'$  (170.2 cm) Weight: 81.6 kg (180 lb) IBW/kg (Calculated) : 66.1 Heparin Dosing Weight: TBW  Vital Signs: Temp: 98.2 F (36.8 C) (11/29 0947) Temp Source: Oral (11/29 0947) BP: 109/83 (11/29 1329) Pulse Rate: 65 (11/29 1329)  Labs: Recent Labs    09/06/22 1927 09/06/22 2100 09/06/22 2200 09/07/22 0600 09/07/22 0730 09/07/22 1447  HGB 12.1*  --   --  10.9*  --   --   HCT 36.2*  --   --  32.6*  --   --   PLT 208  --   --  213  --   --   APTT  --  32  --   --   --   --   LABPROT 14.3  --   --   --   --   --   INR 1.1  --   --   --   --   --   HEPARINUNFRC  --   --   --  0.24*  --  0.29*  CREATININE 0.95  --   --   --  0.87  --   TROPONINIHS 8  --  6  --   --   --      Estimated Creatinine Clearance: 79.6 mL/min (by C-G formula based on SCr of 0.87 mg/dL).   Medical History: History reviewed. No pertinent past medical history.  Medications:  No home meds  Assessment: 71 y.o. M presents with new onset afib. To begin heparin. No AC PTA. CBC ok on baseline.  Ok to change to apixaban for anticoagulation per Dr Florene Glen. Rx will check for copay. Age<80, wt>60kg, scr<1.5  Goal of Therapy:  Monitor platelets by anticoagulation protocol: Yes   Plan:  Dc heparin Apixaban '5mg'$  PO BID Rx will follow peripherally  Onnie Boer, PharmD, BCIDP, AAHIVP, CPP Infectious Disease Pharmacist 09/07/2022 3:55 PM

## 2022-09-07 NOTE — Consult Note (Signed)
CARDIOLOGY CONSULT NOTE  Patient ID: Stephen Cortez MRN: [id number] DOB/AGE: 06/23/1951 71 y.o.  Admit date: 09/06/2022 Referring Physician  Fayrene Helper, MD Primary Physician:  Horald Pollen, MD Reason for Consultation atrial fibrillation with rapid ventricular response.  Patient ID: Stephen Cortez, male    DOB: 03-23-1951, 71 y.o.   MRN: [id number]  Chief Complaint  Patient presents with   Tachycardia   HPI:    Stephen Cortez  is a 21 y.o. Caucasian male patient with no significant prior cardiovascular history admitted with cough and shortness of breath initially seen in an urgent care and in view of elevated heart rate was sent over to the emergency room.  Patient now being treated for possible pneumonia and also new onset atrial flutter with RVR/atrial fibrillation.  I was consulted to opine regarding further management.  Patient's wife is present, patient denies any shortness of breath except for cough and occasional wheezing that started almost 3 weeks ago or more, he went to the urgent care to be evaluated for cough.  No PND or orthopnea, he had noticed mild swelling in his left leg but was not bothering him, no pain, no recent travel, hence he had ignored.  No hemoptysis.  History reviewed. No pertinent past medical history. Past Surgical History:  Procedure Laterality Date   MINOR CARPAL TUNNEL     Social History   Tobacco Use   Smoking status: Never   Smokeless tobacco: Never  Substance Use Topics   Alcohol use: Not Currently    History reviewed. No pertinent family history.  Marital Status: Married  ROS  Review of Systems  Constitutional: Positive for malaise/fatigue.  Cardiovascular:  Positive for dyspnea on exertion and leg swelling (left). Negative for chest pain, near-syncope, palpitations and paroxysmal nocturnal dyspnea.  Respiratory:  Positive for cough.    Objective      09/07/2022   12:20 PM 09/07/2022   11:25 AM 09/07/2022    9:47 AM   Vitals with BMI  Systolic 921 194 174  Diastolic 76 84 82  Pulse 081  144    Blood pressure 124/76, pulse (!) 128, temperature 98.2 F (36.8 C), temperature source Oral, resp. rate 14, height '5\' 7"'$  (1.702 m), weight 81.6 kg, SpO2 97 %.    Physical Exam Neck:     Vascular: No carotid bruit or JVD.  Cardiovascular:     Rate and Rhythm: Tachycardia present. Rhythm irregular.     Pulses: Normal pulses and intact distal pulses.     Heart sounds: No murmur heard. Pulmonary:     Effort: Pulmonary effort is normal.     Breath sounds: Normal breath sounds.  Abdominal:     General: Bowel sounds are normal.     Palpations: Abdomen is soft.  Musculoskeletal:        General: No tenderness.     Right lower leg: No edema.     Left lower leg: Edema (1-2+ below knee. Mild skin redness noted) present.  Skin:    Capillary Refill: Capillary refill takes less than 2 seconds.    Laboratory examination:   Recent Labs    09/06/22 1927 09/07/22 0730  NA 137 134*  K 4.0 3.8  CL 100 98  CO2 29 26  GLUCOSE 107* 106*  BUN 13 10  CREATININE 0.95 0.87  CALCIUM 8.6* 8.3*  GFRNONAA >60 >60   estimated creatinine clearance is 79.6 mL/min (by C-G formula based on SCr of 0.87 mg/dL).  Latest Ref Rng & Units 09/07/2022    7:30 AM 09/06/2022    7:27 PM  CMP  Glucose 70 - 99 mg/dL 106  107   BUN 8 - 23 mg/dL 10  13   Creatinine 0.61 - 1.24 mg/dL 0.87  0.95   Sodium 135 - 145 mmol/L 134  137   Potassium 3.5 - 5.1 mmol/L 3.8  4.0   Chloride 98 - 111 mmol/L 98  100   CO2 22 - 32 mmol/L 26  29   Calcium 8.9 - 10.3 mg/dL 8.3  8.6   Total Protein 6.5 - 8.1 g/dL 6.3  6.8   Total Bilirubin 0.3 - 1.2 mg/dL 0.9  1.3   Alkaline Phos 38 - 126 U/L 108  116   AST 15 - 41 U/L 42  46   ALT 0 - 44 U/L 49  54       Latest Ref Rng & Units 09/07/2022    6:00 AM 09/06/2022    7:27 PM  CBC  WBC 4.0 - 10.5 K/uL 6.9  7.6   Hemoglobin 13.0 - 17.0 g/dL 10.9  12.1   Hematocrit 39.0 - 52.0 % 32.6  36.2    Platelets 150 - 400 K/uL 213  208    Lipid Panel Recent Labs    09/07/22 0730  CHOL 162  TRIG 35  LDLCALC 91  VLDL 7  HDL 64  CHOLHDL 2.5    HEMOGLOBIN A1C No results found for: "HGBA1C", "MPG" TSH Recent Labs    09/06/22 2200  TSH 0.795   BNP (last 3 results) Recent Labs    09/06/22 2000  BNP 274.5*   Cardiac Panel (last 3 results) Recent Labs    09/06/22 1927 09/06/22 2200  TROPONINIHS 8 6     Medications and allergies  No Known Allergies   Current Meds  Medication Sig   tamsulosin (FLOMAX) 0.4 MG CAPS capsule Take 0.4 mg by mouth daily.    Scheduled Meds:  metoprolol tartrate  25 mg Oral BID   tamsulosin  0.4 mg Oral Daily   Continuous Infusions:  diltiazem (CARDIZEM) infusion 15 mg/hr (09/07/22 1126)   heparin 1,400 Units/hr (09/07/22 0718)   PRN Meds:.guaiFENesin-dextromethorphan, levalbuterol, metoprolol tartrate   No intake/output data recorded. No intake/output data recorded.  Net IO Since Admission: No IO data has been entered for this period [09/07/22 1243]   Radiology:   [facility] Chest Port 1 View 09/06/2022 Mild cardiomegaly. Bronchitic changes which appear chronic. No acute airspace disease, pleural effusion or pneumothorax IMPRESSION: No active disease. Chronic bronchitic changes. Mild cardiomegaly.    CT angiogram chest 09/07/2022: 1. No evidence of pulmonary embolism.  2. A 2.1 cm x 1.6 cm x 1.6 cm left lower lobe lung nodule versus focal airspace disease. While this may be infectious in etiology, the presence of an underlying neoplastic process cannot be excluded. Consider one of the following in 3 months for both low-risk and high-risk individual 3. Moderate severity diffuse bilateral peribronchial thickening which may represent sequelae associated with acute or chronic bronchitis.  4. Mild coronary artery calcification.  5. Mild AP window and pretracheal lymphadenopathy which may be reactive in nature.  6. Aortic  atherosclerosis.  Cardiac Studies:   Echocardiogram 09/07/2022:  1. Left ventricular ejection fraction, by estimation, is 40 to 45%. The left ventricle has mildly decreased function. The left ventricle demonstrates global hypokinesis. Left ventricular diastolic parameters are indeterminate.  2. Right ventricular systolic function is normal. The right ventricular size  is normal. There is mildly elevated pulmonary artery systolic pressure.  Mild TR, PASP 43 mmHg.  3. Left atrial size was severely dilated.  4. Right atrial size was severely dilated.  5. No evidence of mitral valve regurgitation.  6. The aortic valve was not well visualized. Aortic valve regurgitation is not visualized.  7. The inferior vena cava is dilated in size with <50% respiratory variability, suggesting right atrial pressure of 15 mmHg.  Lower extremity venous duplex 09/07/2022: No evidence of DVT.  No cystic structures noted in the popliteal fossa.  EKG:  EKG 09/06/2022: Atypical atrial flutter/atrial fibrillation with rapid ventricular sponsor rate of 152 bpm, incomplete right bundle branch block.  Nonspecific T abnormality.  No prior EKG to compare.  Assessment   1.  Atypical atrial flutter with variable AV conduction CHA2DS2-VASc Score is 2.  Yearly risk of stroke: 2.3% (A, +/_ CHF).  Score of 1=0.6; 2=2.2; 3=3.2; 4=4.8; 5=7.2; 6=9.8; 7=>9.8) -(CHF; HTN; vasc disease DM,  Male = 1; Age <65 =0; 65-74 = 1,  >75 =2; stroke/embolism= 2).   2.  Acute on chronic diastolic heart failure, LVEF 40 to 45% with global hypokinesis. 3.  Mild to moderate pulmonary hypertension secondary to #2. 4.  Abnormal LFTs, probably related to acute congestive liver versus part of systemic inflammation last month 5.  Acute bronchitis with CT scan revealing diffuse infiltrates.  No evidence to suggest heart failure that is gross.  Recommendations:   Patient with atrial fibrillation/atypical atrial flutter with variable AV conduction,  rapid ventricular response.  I waited for some time to see whether he would improve with heart rate control with addition of beta-blocker and also continued IV diltiazem and IV hydration.  I saw the patient late in the evening, he still has persistent A-fib with RVR, hence I decided to proceed with administration of sotalol tonight.  I avoided amiodarone in view of interstitial lung infiltration that is noted on the CT scan, Multaq would not be appropriate in view of acute decompensated heart failure and so his flecainide due to heart failure and coronary calcification noted on the CT scan and mild decrease in LVEF.  I will go ahead and administer potassium and magnesium load, after the received first dose, I will administer sotalol and repeat EKG in 1 hour to follow-up on QT interval.  Handwritten instruction given to the RN regarding this.  With regard to heart failure, he has no PND, no orthopnea, he does have mild left leg edema and also shows slight signs of inflammation although there is no tenderness, venous duplex was negative for DVT and CT scan of the chest also negative for PE and hence unlikely that atrial fibrillation was related to PE.  In view of marked biatrial enlargement, decreased LVEF, I suspect he probably has persistent atrial fibrillation of greater than several weeks duration.  He will be started on Eliquis, will plan on cardioversion if he still continues to have A-fib at the end of 3 weeks of anticoagulation.  He will also need ischemic workup once heart rate is well-controlled.  Overall my suspicion for CAD is very low.  I have placed orders for magnesium level, BNP, CMP in the morning.  His wife is present and all questions answered.   [name], MD, [facility] 09/07/2022, 12:43 PM Office: [phone]

## 2022-09-07 NOTE — Progress Notes (Signed)
EKG reviewed. Normal QTc. Will increase Sotalol to 120 mg BID starting tomorrow

## 2022-09-07 NOTE — ED Notes (Signed)
ED TO INPATIENT HANDOFF REPORT  ED Nurse Name and Phone #: Jeannie Done Edgard Name/Age/Gender Stephen Cortez 71 y.o. male Room/Bed: 007C/007C  Code Status   Code Status: Full Code  Home/SNF/Other Home Patient oriented to: self, place, time, and situation Is this baseline? Yes   Triage Complete: Triage complete  Chief Complaint Atrial fibrillation with rapid ventricular response ([place]) [I48.91]  Triage Note BIBA from UC with c/o "twinge in chest" since last night. EKG found him in AFIB RVR 130-180s with EMS. No prior h/o AFIB.  Cardizem '10mg'$  X2 given en route without success.    EMSVS: 120/80  130 HR 20G R FA   Allergies No Known Allergies  Level of Care/Admitting Diagnosis ED Disposition     ED Disposition  Admit   Condition  --   Comment  Hospital Area: [facility] [[id number]]  Level of Care: Progressive [102]  Admit to Progressive based on following criteria: CARDIOVASCULAR & THORACIC of moderate stability with acute coronary syndrome symptoms/low risk myocardial infarction/hypertensive urgency/arrhythmias/heart failure potentially compromising stability and stable post cardiovascular intervention patients.  May place patient in observation at [facility] or [facility] if equivalent level of care is available:: Yes  Covid Evaluation: Asymptomatic - no recent exposure (last 10 days) testing not required  Diagnosis: Atrial fibrillation with rapid ventricular response [facility]) [[id number]]  Admitting Physician: Shela Leff [[id number]]  Attending Physician: Shela Leff [[id number]]          B Medical/Surgery History History reviewed. No pertinent past medical history. Past Surgical History:  Procedure Laterality Date   MINOR CARPAL TUNNEL       A IV Location/Drains/Wounds Patient Lines/Drains/Airways Status     Active Line/Drains/Airways     Name Placement date Placement time Site Days   Peripheral IV 09/06/22 20 G  Anterior;Proximal;Right Forearm 09/06/22  1827  Forearm  1   Peripheral IV 09/06/22 18 G Left Forearm 09/06/22  2216  Forearm  1            Intake/Output Last 24 hours No intake or output data in the 24 hours ending 09/07/22 0753  Labs/Imaging Results for orders placed or performed during the hospital encounter of 09/06/22 (from the past 48 hour(s))  Comprehensive metabolic panel     Status: Abnormal   Collection Time: 09/06/22  7:27 PM  Result Value Ref Range   Sodium 137 135 - 145 mmol/L   Potassium 4.0 3.5 - 5.1 mmol/L   Chloride 100 98 - 111 mmol/L   CO2 29 22 - 32 mmol/L   Glucose, Bld 107 (H) 70 - 99 mg/dL    Comment: Glucose reference range applies only to samples taken after fasting for at least 8 hours.   BUN 13 8 - 23 mg/dL   Creatinine, Ser 0.95 0.61 - 1.24 mg/dL   Calcium 8.6 (L) 8.9 - 10.3 mg/dL   Total Protein 6.8 6.5 - 8.1 g/dL   Albumin 3.6 3.5 - 5.0 g/dL   AST 46 (H) 15 - 41 U/L   ALT 54 (H) 0 - 44 U/L   Alkaline Phosphatase 116 38 - 126 U/L   Total Bilirubin 1.3 (H) 0.3 - 1.2 mg/dL   GFR, Estimated >60 >60 mL/min    Comment: (NOTE) Calculated using the CKD-EPI Creatinine Equation (2021)    Anion gap 8 5 - 15    Comment: Performed at [facility], [place] [address] [place], [place] [address]  Troponin I (High Sensitivity)  Status: None   Collection Time: 09/06/22  7:27 PM  Result Value Ref Range   Troponin I (High Sensitivity) 8 <18 ng/L    Comment: (NOTE) Elevated high sensitivity troponin I (hsTnI) values and significant  changes across serial measurements may suggest ACS but many other  chronic and acute conditions are known to elevate hsTnI results.  Refer to the "Links" section for chest pain algorithms and additional  guidance. Performed at [facility], [place] [address] [place], [place] [address]   CBC with Differential     Status: Abnormal   Collection Time: 09/06/22  7:27 PM  Result Value Ref Range   WBC 7.6 4.0 - 10.5 K/uL    RBC 3.88 (L) 4.22 - 5.81 MIL/uL   Hemoglobin 12.1 (L) 13.0 - 17.0 g/dL   HCT 36.2 (L) 39.0 - 52.0 %   MCV 93.3 80.0 - 100.0 fL   MCH 31.2 26.0 - 34.0 pg   MCHC 33.4 30.0 - 36.0 g/dL   RDW 14.1 11.5 - 15.5 %   Platelets 208 150 - 400 K/uL   nRBC 0.0 0.0 - 0.2 %   Neutrophils Relative % 70 %   Neutro Abs 5.3 1.7 - 7.7 K/uL   Lymphocytes Relative 18 %   Lymphs Abs 1.4 0.7 - 4.0 K/uL   Monocytes Relative 10 %   Monocytes Absolute 0.8 0.1 - 1.0 K/uL   Eosinophils Relative 2 %   Eosinophils Absolute 0.2 0.0 - 0.5 K/uL   Basophils Relative 0 %   Basophils Absolute 0.0 0.0 - 0.1 K/uL   Immature Granulocytes 0 %   Abs Immature Granulocytes 0.02 0.00 - 0.07 K/uL    Comment: Performed at [facility] [address] [place], [place] [address]  Protime-INR     Status: None   Collection Time: 09/06/22  7:27 PM  Result Value Ref Range   Prothrombin Time 14.3 11.4 - 15.2 seconds   INR 1.1 0.8 - 1.2    Comment: (NOTE) INR goal varies based on device and disease states. Performed at [facility], [place] [address], [place], [place] [address]   Resp Panel by RT-PCR (Flu A&B, Covid) Anterior Nasal Swab     Status: None   Collection Time: 09/06/22  7:27 PM   Specimen: Anterior Nasal Swab  Result Value Ref Range   SARS Coronavirus 2 by RT PCR NEGATIVE NEGATIVE    Comment: (NOTE) SARS-CoV-2 target nucleic acids are NOT DETECTED.  The SARS-CoV-2 RNA is generally detectable in upper respiratory specimens during the acute phase of infection. The lowest concentration of SARS-CoV-2 viral copies this assay can detect is 138 copies/mL. A negative result does not preclude SARS-Cov-2 infection and should not be used as the sole basis for treatment or other patient management decisions. A negative result may occur with  improper specimen collection/handling, submission of specimen other than nasopharyngeal swab, presence of viral mutation(s) within the areas targeted by this assay, and  inadequate number of viral copies(<138 copies/mL). A negative result must be combined with clinical observations, patient history, and epidemiological information. The expected result is Negative.  Fact Sheet for Patients:  EntrepreneurPulse.com.au  Fact Sheet for Healthcare Providers:  IncredibleEmployment.be  This test is no t yet approved or cleared by the [place] FDA and  has been authorized for detection and/or diagnosis of SARS-CoV-2 by FDA under an Emergency Use Authorization (EUA). This EUA will remain  in effect (meaning this test can be used) for  the duration of the COVID-19 declaration under Section 564(b)(1) of the Act, 21 U.S.C.section 360bbb-3(b)(1), unless the authorization is terminated  or revoked sooner.       Influenza A by PCR NEGATIVE NEGATIVE   Influenza B by PCR NEGATIVE NEGATIVE    Comment: (NOTE) The Xpert Xpress SARS-CoV-2/FLU/RSV plus assay is intended as an aid in the diagnosis of influenza from Nasopharyngeal swab specimens and should not be used as a sole basis for treatment. Nasal washings and aspirates are unacceptable for Xpert Xpress SARS-CoV-2/FLU/RSV testing.  Fact Sheet for Patients: EntrepreneurPulse.com.au  Fact Sheet for Healthcare Providers: IncredibleEmployment.be  This test is not yet approved or cleared by the [place] FDA and has been authorized for detection and/or diagnosis of SARS-CoV-2 by FDA under an Emergency Use Authorization (EUA). This EUA will remain in effect (meaning this test can be used) for the duration of the COVID-19 declaration under Section 564(b)(1) of the Act, 21 U.S.C. section 360bbb-3(b)(1), unless the authorization is terminated or revoked.  Performed at [facility], [place] [address], [place], [place] [address]   Magnesium     Status: None   Collection Time: 09/06/22  7:27 PM  Result Value Ref Range   Magnesium 2.0  1.7 - 2.4 mg/dL    Comment: Performed at [facility], [place] [address], [place], [place] [address]  Brain natriuretic peptide     Status: Abnormal   Collection Time: 09/06/22  8:00 PM  Result Value Ref Range   B Natriuretic Peptide 274.5 (H) 0.0 - 100.0 pg/mL    Comment: Performed at [facility] [address], [place], [place] [address]  APTT     Status: None   Collection Time: 09/06/22  9:00 PM  Result Value Ref Range   aPTT 32 24 - 36 seconds    Comment: Performed at [facility] [address] [place], [place] [address]  Troponin I (High Sensitivity)     Status: None   Collection Time: 09/06/22 10:00 PM  Result Value Ref Range   Troponin I (High Sensitivity) 6 <18 ng/L    Comment: (NOTE) Elevated high sensitivity troponin I (hsTnI) values and significant  changes across serial measurements may suggest ACS but many other  chronic and acute conditions are known to elevate hsTnI results.  Refer to the "Links" section for chest pain algorithms and additional  guidance. Performed at [facility], [place] [address], [place], [place] [address]   TSH     Status: None   Collection Time: 09/06/22 10:00 PM  Result Value Ref Range   TSH 0.795 0.350 - 4.500 uIU/mL    Comment: Performed by a 3rd Generation assay with a functional sensitivity of <=0.01 uIU/mL. Performed at [facility], [place] [address] [place], [place] [address]   Heparin level (unfractionated)     Status: Abnormal   Collection Time: 09/07/22  6:00 AM  Result Value Ref Range   Heparin Unfractionated 0.24 (L) 0.30 - 0.70 IU/mL    Comment: (NOTE) The clinical reportable range upper limit is being lowered to >1.10 to align with the FDA approved guidance for the current laboratory assay.  If heparin results are below expected values, and patient dosage has  been confirmed, suggest follow up testing of antithrombin III levels. Performed at [facility], [place] [address] [place],  [place] [address]   CBC     Status: Abnormal   Collection Time: 09/07/22  6:00  AM  Result Value Ref Range   WBC 6.9 4.0 - 10.5 K/uL   RBC 3.51 (L) 4.22 - 5.81 MIL/uL   Hemoglobin 10.9 (L) 13.0 - 17.0 g/dL   HCT 32.6 (L) 39.0 - 52.0 %   MCV 92.9 80.0 - 100.0 fL   MCH 31.1 26.0 - 34.0 pg   MCHC 33.4 30.0 - 36.0 g/dL   RDW 14.4 11.5 - 15.5 %   Platelets 213 150 - 400 K/uL   nRBC 0.0 0.0 - 0.2 %    Comment: Performed at [facility], [place] [address], [place], [place] [address]   CT Angio Chest Pulmonary Embolism (PE) W or WO Contrast  Result Date: 09/07/2022 CLINICAL DATA:  Chest pain. EXAM: CT ANGIOGRAPHY CHEST WITH CONTRAST TECHNIQUE: Multidetector CT imaging of the chest was performed using the standard protocol during bolus administration of intravenous contrast. Multiplanar CT image reconstructions and MIPs were obtained to evaluate the vascular anatomy. RADIATION DOSE REDUCTION: This exam was performed according to the departmental dose-optimization program which includes automated exposure control, adjustment of the mA and/or kV according to patient size and/or use of iterative reconstruction technique. CONTRAST:  23m OMNIPAQUE IOHEXOL 350 MG/ML SOLN COMPARISON:  None Available. FINDINGS: Cardiovascular: There is mild calcification of the aortic arch, without evidence of aortic aneurysm. Satisfactory opacification of the pulmonary arteries to the segmental level. No evidence of pulmonary embolism. Normal heart size with mild coronary artery calcification. No pericardial effusion. Mediastinum/Nodes: There is mild AP window and pretracheal lymphadenopathy. Thyroid gland, trachea, and esophagus demonstrate no significant findings. Lungs/Pleura: A 2.1 cm x 1.6 cm x 1.6 cm lung nodule versus focal airspace disease is seen within the posteromedial aspect of the left lower lobe (axial CT image 73, CT series 6). Mild atelectatic changes are seen within the lingular region, right middle lobe and  bilateral lung bases. Moderate severity diffuse bilateral peribronchial thickening is noted. There is no evidence of a pleural effusion or pneumothorax. Upper Abdomen: No acute abnormality. Musculoskeletal: A 7 mm benign-appearing sclerotic focus is seen within the anterior aspect of the T3 vertebral body. Multilevel degenerative changes seen throughout the thoracic spine. Review of the MIP images confirms the above findings. IMPRESSION: 1. No evidence of pulmonary embolism. 2. 2.1 cm x 1.6 cm x 1.6 cm left lower lobe lung nodule versus focal airspace disease. While this may be infectious in etiology, the presence of an underlying neoplastic process cannot be excluded. Consider one of the following in 3 months for both low-risk and high-risk individuals: (a) repeat chest CT, (b) follow-up PET-CT, or (c) tissue sampling. This recommendation follows the consensus statement: Guidelines for Management of Incidental Pulmonary Nodules Detected on CT Images: From the Fleischner Society 2017; Radiology 2017; 284:228-243. 3. Moderate severity diffuse bilateral peribronchial thickening which may represent sequelae associated with acute or chronic bronchitis. 4. Mild coronary artery calcification. 5. Mild AP window and pretracheal lymphadenopathy which may be reactive in nature. 6. Aortic atherosclerosis. Aortic Atherosclerosis (ICD10-I70.0). Electronically Signed   By: TVirgina NorfolkM.D.   On: 09/07/2022 00:26   DG Chest Port 1 View  Result Date: 09/06/2022 CLINICAL DATA:  Atrial fibrillation EXAM: PORTABLE CHEST 1 VIEW COMPARISON:  02/09/2021 FINDINGS: Mild cardiomegaly. Bronchitic changes which appear chronic. No acute airspace disease, pleural effusion or pneumothorax IMPRESSION: No active disease. Chronic bronchitic changes. Mild cardiomegaly. Electronically Signed   By: KDonavan FoilM.D.   On: 09/06/2022 20:27    Pending Labs Unresulted Labs (From admission, onward)  Start     Ordered   09/08/22 0500   Heparin level (unfractionated)  Daily at 5am,   R      09/06/22 2211   09/07/22 1500  Heparin level (unfractionated)  Once-Timed,   TIMED        09/07/22 0711   09/07/22 0730  Comprehensive metabolic panel  Once,   R        09/07/22 0730   09/07/22 0500  CBC  Daily at 5am,   R      09/06/22 2211            Vitals/Pain Today's Vitals   09/07/22 0645 09/07/22 0700 09/07/22 0717 09/07/22 0730  BP: 104/76 118/79  111/74  Pulse: 93 80    Resp: 15 19    Temp:   98.4 F (36.9 C)   TempSrc:   Oral   SpO2: 94% 94%  97%  Weight:      Height:        Isolation Precautions No active isolations  Medications Medications  diltiazem (CARDIZEM) 125 mg in dextrose 5% 125 mL (1 mg/mL) infusion (12.5 mg/hr Intravenous New Bag/Given 09/07/22 0728)  heparin ADULT infusion 100 units/mL (25000 units/267m) (1,400 Units/hr Intravenous Rate/Dose Change 09/07/22 0718)  guaiFENesin-dextromethorphan (ROBITUSSIN DM) 100-10 MG/5ML syrup 5 mL (has no administration in time range)  sodium chloride 0.9 % bolus 1,000 mL (0 mLs Intravenous Stopped 09/06/22 2140)  diltiazem (CARDIZEM) injection 20 mg (20 mg Intravenous Given 09/06/22 1945)  heparin bolus via infusion 4,000 Units (4,000 Units Intravenous Bolus from Bag 09/06/22 2245)  iohexol (OMNIPAQUE) 350 MG/ML injection 75 mL (75 mLs Intravenous Contrast Given 09/07/22 0005)    Mobility walks Low fall risk   Focused Assessments Cardiac Assessment Handoff:  Cardiac Rhythm: (S) Atrial fibrillation No results found for: "CKTOTAL", "CKMB", "CKMBINDEX", "TROPONINI" No results found for: "DDIMER" Does the Patient currently have chest pain? No    R Recommendations: See Admitting Provider Note  Report given to:   Additional Notes: afib, dilt gtt, heparin gtt, 2 PIV sites, uses urinal

## 2022-09-07 NOTE — Progress Notes (Signed)
ANTICOAGULATION CONSULT NOTE  Pharmacy Consult for Heparin Indication: atrial fibrillation  No Known Allergies  Patient Measurements: Height: '5\' 7"'$  (170.2 cm) Weight: 81.6 kg (180 lb) IBW/kg (Calculated) : 66.1 Heparin Dosing Weight: TBW  Vital Signs: Temp: 98.9 F (37.2 C) (11/29 0257) Temp Source: Oral (11/29 0257) BP: 118/79 (11/29 0700) Pulse Rate: 80 (11/29 0700)  Labs: Recent Labs    09/06/22 1927 09/06/22 2100 09/06/22 2200 09/07/22 0600  HGB 12.1*  --   --  10.9*  HCT 36.2*  --   --  32.6*  PLT 208  --   --  213  APTT  --  32  --   --   LABPROT 14.3  --   --   --   INR 1.1  --   --   --   HEPARINUNFRC  --   --   --  0.24*  CREATININE 0.95  --   --   --   TROPONINIHS 8  --  6  --      Estimated Creatinine Clearance: 72.9 mL/min (by C-G formula based on SCr of 0.95 mg/dL).   Medical History: History reviewed. No pertinent past medical history.  Medications:  No home meds  Assessment: 71 y.o. M presents with new onset afib. To begin heparin. No AC PTA. CBC ok on baseline.  Initial heparin level subtherapeutic on 1250 units/hr  Goal of Therapy:  Heparin level 0.3-0.7 units/ml Monitor platelets by anticoagulation protocol: Yes   Plan:  Increase heparin gtt to 1400 units/hr F/u 8 hour heparin level  Bertis Ruddy, PharmD Clinical Pharmacist ED Pharmacist Phone # [phone] 09/07/2022 7:10 AM

## 2022-09-07 NOTE — Progress Notes (Signed)
   09/07/22 0947  Assess: MEWS Score  Temp 98.2 F (36.8 C)  BP 112/82  MAP (mmHg) 93  Pulse Rate (!) 144  ECG Heart Rate (!) 146  Resp 14  Level of Consciousness Alert  SpO2 97 %  O2 Device Room Air  Assess: MEWS Score  MEWS Temp 0  MEWS Systolic 0  MEWS Pulse 3  MEWS RR 0  MEWS LOC 0  MEWS Score 3  MEWS Score Color Yellow  Assess: if the MEWS score is Yellow or Red  Were vital signs taken at a resting state? Yes  Focused Assessment No change from prior assessment  Does the patient meet 2 or more of the SIRS criteria? No  Does the patient have a confirmed or suspected source of infection? No  MEWS guidelines implemented *See Row Information* Yes  Treat  MEWS Interventions Other (Comment) (pt on anti arrythymic drip)  Pain Scale 0-10  Pain Score 0  Take Vital Signs  Increase Vital Sign Frequency  Yellow: Q 2hr X 2 then Q 4hr X 2, if remains yellow, continue Q 4hrs  Escalate  MEWS: Escalate Yellow: discuss with charge nurse/RN and consider discussing with provider and RRT  Notify: Charge Nurse/RN  Name of Charge Nurse/RN Notified Stephanie, RN  Date Charge Nurse/RN Notified 09/07/22  Time Charge Nurse/RN Notified 1008  Document  Patient Outcome Not stable and remains on department (pt off unit to get ECHO done)  Progress note created (see row info) Yes  Assess: SIRS CRITERIA  SIRS Temperature  0  SIRS Pulse 1  SIRS Respirations  0  SIRS WBC 1  SIRS Score Sum  2

## 2022-09-07 NOTE — Progress Notes (Signed)
Lower extremity venous left study completed.  Preliminary results relayed to Florene Glen, MD.  See CV Proc for preliminary results report.   Darlin Coco, RDMS, RVT

## 2022-09-07 NOTE — Progress Notes (Signed)
PROGRESS NOTE    Stephen Cortez  UTM:[id number] DOB: 1951-03-09 DOA: 09/06/2022 PCP: Horald Pollen, MD  Chief Complaint  Patient presents with   Tachycardia    Brief Narrative:  Stephen Cortez is Stephen Cortez 71 y.o. male with medical history significant of pneumonia, back pain seen urgent care today for cough and nasal congestion.  Noted to be tachycardic and EKG concerning for Stephen Ammon-fib versus flutter and sent to the ED for further evaluation.  Rate in the 130s to 180s with EMS and was given Cardizem 10 mg x 2 during transport.  On arrival to the ED, noted to be in Stephen Cortez-fib with RVR with rate in the 140s to 150s.  Labs showing no leukocytosis, hemoglobin 12.1, MCV 93.3, AST 46, ALT 54, alk phos 116, T. bili 1.3, troponin negative, COVID and influenza PCR negative, magnesium 2.0, BNP 274.  Chest x-ray showing chronic bronchitic changes, mild cardiomegaly, and no active disease. Patient was given IV Cardizem 20 mg and started on continuous infusion.  He is also given 1 L normal saline bolus.  TRH called to admit.   Patient reports 7 to 8-week history of cough and congestion.  For the past 1 day he has noticed that his left leg is swollen and his heart is racing.  He denies history of Stephen Cortez or any cardiac problems.  Denies history of recent travel, surgeries, or blood clots.  Denies chest pain.  Denies fevers, vomiting, diarrhea, or abdominal pain.  Denies alcohol use.   Assessment & Plan:   Principal Problem:   Atrial fibrillation with rapid ventricular response (HCC) Active Problems:   CHF (congestive heart failure) (HCC)   Chronic bronchitis (HCC)   Normocytic anemia   Transaminitis  New onset Stephen Cortez with RVR - rate remains in the 120's-130's, on diltiazem gtt - not much further room for titration with blood pressure - TSH wnl, CT PE protocol without PE - does sound like he has an upper respiratory infection which could be contributing factor, but underlying risk factor for afib unclear at  this point (imaging concerning for acute or chronic bronchitis, ?chronic pulm dz).   - will adjust diltiazem as able (will try to wean this off with decreased EF).  Start metoprolol. - chadvasc is 2 (age and HF).  Heparin gtt for now. - echo with EF 40-45%, mildly decreased function, global hypokinesis - will consult cardiology, appreciate assistance   Elevated BNP No documented history of CHF and doesn't appear volume overloaded.  BNP 274.  Chest x-ray showing mild cardiomegaly but no pulmonary edema.  - echo is currently pending   Chronic bronchitis Patient is reporting cough and congestion for the past 7 or 8 weeks.  COVID and influenza PCR negative.  Chest x-ray showing chronic bronchitic changes.  Mild wheezing on exam. -Antitussive as needed -trial of xopenox, I don't think he currently needs steroids, will follow   Left Lower Lobe Nodule vs Focal Airspace Disease - in setting of upper respiratory symptoms, possibly infectious - needs follow up to rule out neoplastic process   Mild normocytic anemia Patient is not endorsing any symptoms of GI bleed.  No prior labs for comparison. -Continue to monitor   Mild transaminitis Patient is not endorsing any GI symptoms.  Denies alcohol use. -Repeat LFTs in Stephen Cortez.m., pursue further workup if not improving     DVT prophylaxis: heparin gtt Code Status: full Family Communication: none at bedside Disposition:   Status is: Observation The patient will require care spanning >  2 midnights and should be moved to inpatient because: continued need for rate control   Consultants:  cardiology  Procedures:  none  Antimicrobials:  Anti-infectives (From admission, onward)    None       Subjective: No complaints this morning No CP or SOB LLE swollen for Stephen Cortez few days  Objective: Vitals:   09/07/22 0700 09/07/22 0717 09/07/22 0730 09/07/22 0815  BP: 118/79  111/74 115/81  Pulse: 80   87  Resp: 19   17  Temp:  98.4 F (36.9 C)     TempSrc:  Oral    SpO2: 94%  97% 92%  Weight:      Height:       No intake or output data in the 24 hours ending 09/07/22 0857 Filed Weights   09/06/22 1938  Weight: 81.6 kg    Examination:  General exam: Appears calm and comfortable  Respiratory system: mild expiratory wheezing bilaterally Cardiovascular system: irregularly irregular, tachy Gastrointestinal system: Abdomen is nondistended, soft and nontender.  Central nervous system: Alert and oriented. No focal neurological deficits. Extremities: mild LLE edema which has been present for Stephen Cortez few days per discussion    Data Reviewed: I have personally reviewed following labs and imaging studies  CBC: Recent Labs  Lab 09/06/22 1927 09/07/22 0600  WBC 7.6 6.9  NEUTROABS 5.3  --   HGB 12.1* 10.9*  HCT 36.2* 32.6*  MCV 93.3 92.9  PLT 208 924    Basic Metabolic Panel: Recent Labs  Lab 09/06/22 1927 09/07/22 0730  NA 137 134*  K 4.0 3.8  CL 100 98  CO2 29 26  GLUCOSE 107* 106*  BUN 13 10  CREATININE 0.95 0.87  CALCIUM 8.6* 8.3*  MG 2.0  --     GFR: Estimated Creatinine Clearance: 79.6 mL/min (by C-G formula based on SCr of 0.87 mg/dL).  Liver Function Tests: Recent Labs  Lab 09/06/22 1927 09/07/22 0730  AST 46* 42*  ALT 54* 49*  ALKPHOS 116 108  BILITOT 1.3* 0.9  PROT 6.8 6.3*  ALBUMIN 3.6 3.1*    CBG: No results for input(s): "GLUCAP" in the last 168 hours.   Recent Results (from the past 240 hour(s))  Resp Panel by RT-PCR (Flu Stephen Cortez, Covid) Anterior Nasal Swab     Status: None   Collection Time: 09/06/22  7:27 PM   Specimen: Anterior Nasal Swab  Result Value Ref Range Status   SARS Coronavirus 2 by RT PCR NEGATIVE NEGATIVE Final    Comment: (NOTE) SARS-CoV-2 target nucleic acids are NOT DETECTED.  The SARS-CoV-2 RNA is generally detectable in upper respiratory specimens during the acute phase of infection. The lowest concentration of SARS-CoV-2 viral copies this assay can detect is 138  copies/mL. Stephen Cortez negative result does not preclude SARS-Cov-2 infection and should not be used as the sole basis for treatment or other patient management decisions. Stephen Cortez negative result may occur with  improper specimen collection/handling, submission of specimen other than nasopharyngeal swab, presence of viral mutation(s) within the areas targeted by this assay, and inadequate number of viral copies(<138 copies/mL). Catlyn Shipton negative result must be combined with clinical observations, patient history, and epidemiological information. The expected result is Negative.  Fact Sheet for Patients:  EntrepreneurPulse.com.au  Fact Sheet for Healthcare Providers:  IncredibleEmployment.be  This test is no t yet approved or cleared by the [place] FDA and  has been authorized for detection and/or diagnosis of SARS-CoV-2 by FDA under an Emergency Use Authorization (EUA). This EUA  will remain  in effect (meaning this test can be used) for the duration of the COVID-19 declaration under Section 564(b)(1) of the Act, 21 U.S.C.section 360bbb-3(b)(1), unless the authorization is terminated  or revoked sooner.       Influenza Philicia Heyne by PCR NEGATIVE NEGATIVE Final   Influenza B by PCR NEGATIVE NEGATIVE Final    Comment: (NOTE) The Xpert Xpress SARS-CoV-2/FLU/RSV plus assay is intended as an aid in the diagnosis of influenza from Nasopharyngeal swab specimens and should not be used as Madelynn Malson sole basis for treatment. Nasal washings and aspirates are unacceptable for Xpert Xpress SARS-CoV-2/FLU/RSV testing.  Fact Sheet for Patients: EntrepreneurPulse.com.au  Fact Sheet for Healthcare Providers: IncredibleEmployment.be  This test is not yet approved or cleared by the [place] FDA and has been authorized for detection and/or diagnosis of SARS-CoV-2 by FDA under an Emergency Use Authorization (EUA). This EUA will remain in effect (meaning  this test can be used) for the duration of the COVID-19 declaration under Section 564(b)(1) of the Act, 21 U.S.C. section 360bbb-3(b)(1), unless the authorization is terminated or revoked.  Performed at [facility], [place] [address], [place], [place] [address]          Radiology Studies: CT Angio Chest Pulmonary Embolism (PE) W or WO Contrast  Result Date: 09/07/2022 CLINICAL DATA:  Chest pain. EXAM: CT ANGIOGRAPHY CHEST WITH CONTRAST TECHNIQUE: Multidetector CT imaging of the chest was performed using the standard protocol during bolus administration of intravenous contrast. Multiplanar CT image reconstructions and MIPs were obtained to evaluate the vascular anatomy. RADIATION DOSE REDUCTION: This exam was performed according to the departmental dose-optimization program which includes automated exposure control, adjustment of the mA and/or kV according to patient size and/or use of iterative reconstruction technique. CONTRAST:  30m OMNIPAQUE IOHEXOL 350 MG/ML SOLN COMPARISON:  None Available. FINDINGS: Cardiovascular: There is mild calcification of the aortic arch, without evidence of aortic aneurysm. Satisfactory opacification of the pulmonary arteries to the segmental level. No evidence of pulmonary embolism. Normal heart size with mild coronary artery calcification. No pericardial effusion. Mediastinum/Nodes: There is mild AP window and pretracheal lymphadenopathy. Thyroid gland, trachea, and esophagus demonstrate no significant findings. Lungs/Pleura: Karenann Mcgrory 2.1 cm x 1.6 cm x 1.6 cm lung nodule versus focal airspace disease is seen within the posteromedial aspect of the left lower lobe (axial CT image 73, CT series 6). Mild atelectatic changes are seen within the lingular region, right middle lobe and bilateral lung bases. Moderate severity diffuse bilateral peribronchial thickening is noted. There is no evidence of Ewart Carrera pleural effusion or pneumothorax. Upper Abdomen: No acute abnormality.  Musculoskeletal: Marylin Lathon 7 mm benign-appearing sclerotic focus is seen within the anterior aspect of the T3 vertebral body. Multilevel degenerative changes seen throughout the thoracic spine. Review of the MIP images confirms the above findings. IMPRESSION: 1. No evidence of pulmonary embolism. 2. 2.1 cm x 1.6 cm x 1.6 cm left lower lobe lung nodule versus focal airspace disease. While this may be infectious in etiology, the presence of an underlying neoplastic process cannot be excluded. Consider one of the following in 3 months for both low-risk and high-risk individuals: (Domingo Fuson) repeat chest CT, (b) follow-up PET-CT, or (c) tissue sampling. This recommendation follows the consensus statement: Guidelines for Management of Incidental Pulmonary Nodules Detected on CT Images: From the Fleischner Society 2017; Radiology 2017; 284:228-243. 3. Moderate severity diffuse bilateral peribronchial thickening which may represent sequelae associated with acute or chronic bronchitis. 4. Mild coronary artery calcification. 5. Mild AP  window and pretracheal lymphadenopathy which may be reactive in nature. 6. Aortic atherosclerosis. Aortic Atherosclerosis (ICD10-I70.0). Electronically Signed   By: Virgina Norfolk M.D.   On: 09/07/2022 00:26   DG Chest Port 1 View  Result Date: 09/06/2022 CLINICAL DATA:  Atrial fibrillation EXAM: PORTABLE CHEST 1 VIEW COMPARISON:  02/09/2021 FINDINGS: Mild cardiomegaly. Bronchitic changes which appear chronic. No acute airspace disease, pleural effusion or pneumothorax IMPRESSION: No active disease. Chronic bronchitic changes. Mild cardiomegaly. Electronically Signed   By: Donavan Foil M.D.   On: 09/06/2022 20:27        Scheduled Meds: Continuous Infusions:  diltiazem (CARDIZEM) infusion 12.5 mg/hr (09/07/22 0728)   heparin 1,400 Units/hr (09/07/22 0718)     LOS: 0 days    Time spent: over 30 min    Fayrene Helper, MD [facility]   To contact the attending provider  between 7A-7P or the covering provider during after hours 7P-7A, please log into the web site [email or web address] and access using universal [place] password for that web site. If you do not have the password, please call the hospital operator.  09/07/2022, 8:57 AM

## 2022-09-07 NOTE — Progress Notes (Signed)
Echocardiogram 2D Echocardiogram has been performed.   Oneal Deputy Stachia Slutsky RDCS 09/07/2022, 10:35 AM

## 2022-09-08 ENCOUNTER — Other Ambulatory Visit (HOSPITAL_COMMUNITY): Payer: Self-pay

## 2022-09-08 DIAGNOSIS — I509 Heart failure, unspecified: Secondary | ICD-10-CM

## 2022-09-08 DIAGNOSIS — I4891 Unspecified atrial fibrillation: Secondary | ICD-10-CM | POA: Diagnosis not present

## 2022-09-08 DIAGNOSIS — I499 Cardiac arrhythmia, unspecified: Secondary | ICD-10-CM

## 2022-09-08 HISTORY — DX: Cardiac arrhythmia, unspecified: I49.9

## 2022-09-08 HISTORY — DX: Heart failure, unspecified: I50.9

## 2022-09-08 LAB — COMPREHENSIVE METABOLIC PANEL
ALT: 46 U/L — ABNORMAL HIGH (ref 0–44)
AST: 35 U/L (ref 15–41)
Albumin: 3 g/dL — ABNORMAL LOW (ref 3.5–5.0)
Alkaline Phosphatase: 99 U/L (ref 38–126)
Anion gap: 10 (ref 5–15)
BUN: 12 mg/dL (ref 8–23)
CO2: 27 mmol/L (ref 22–32)
Calcium: 8.8 mg/dL — ABNORMAL LOW (ref 8.9–10.3)
Chloride: 100 mmol/L (ref 98–111)
Creatinine, Ser: 0.81 mg/dL (ref 0.61–1.24)
GFR, Estimated: >60 mL/min (ref >60)
Glucose, Bld: 122 mg/dL — ABNORMAL HIGH (ref 70–99)
Potassium: 4.3 mmol/L (ref 3.5–5.1)
Sodium: 137 mmol/L (ref 135–145)
Total Bilirubin: 0.8 mg/dL (ref 0.3–1.2)
Total Protein: 6.1 g/dL — ABNORMAL LOW (ref 6.5–8.1)

## 2022-09-08 LAB — CBC
HCT: 35.6 % — ABNORMAL LOW (ref 39.0–52.0)
Hemoglobin: 12 g/dL — ABNORMAL LOW (ref 13.0–17.0)
MCH: 31.1 pg (ref 26.0–34.0)
MCHC: 33.7 g/dL (ref 30.0–36.0)
MCV: 92.2 fL (ref 80.0–100.0)
Platelets: 252 10*3/uL (ref 150–400)
RBC: 3.86 MIL/uL — ABNORMAL LOW (ref 4.22–5.81)
RDW: 14.2 % (ref 11.5–15.5)
WBC: 7.2 10*3/uL (ref 4.0–10.5)
nRBC: 0 % (ref 0.0–0.2)

## 2022-09-08 LAB — BRAIN NATRIURETIC PEPTIDE: B Natriuretic Peptide: 220.5 pg/mL — ABNORMAL HIGH (ref 0.0–100.0)

## 2022-09-08 LAB — HEMOGLOBIN A1C
Hgb A1c MFr Bld: 5.8 % — ABNORMAL HIGH (ref 4.8–5.6)
Mean Plasma Glucose: 120 mg/dL

## 2022-09-08 LAB — MAGNESIUM: Magnesium: 2.1 mg/dL (ref 1.7–2.4)

## 2022-09-08 LAB — GLUCOSE, CAPILLARY: Glucose-Capillary: 118 mg/dL — ABNORMAL HIGH (ref 70–99)

## 2022-09-08 MED ORDER — DOXYCYCLINE HYCLATE 100 MG PO TABS: 100.0000 mg | ORAL_TABLET | Freq: Two times a day (BID) | ORAL | 0 refills | Status: AC

## 2022-09-08 MED ORDER — LORATADINE 10 MG PO TABS: 10.0000 mg | ORAL_TABLET | Freq: Every day | ORAL | Status: DC

## 2022-09-08 MED ORDER — APIXABAN 5 MG PO TABS: 5.0000 mg | ORAL_TABLET | Freq: Two times a day (BID) | ORAL | 1 refills | Status: DC

## 2022-09-08 MED ORDER — SOTALOL HCL 120 MG PO TABS
120.0000 mg | ORAL_TABLET | Freq: Two times a day (BID) | ORAL | Status: DC
  Administered 2022-09-08: 120 mg via ORAL
  Filled 2022-09-08 (×2): qty 1

## 2022-09-08 MED ORDER — MELATONIN 5 MG PO TABS
5.0000 mg | ORAL_TABLET | Freq: Every evening | ORAL | Status: DC | PRN
  Administered 2022-09-08: 5 mg via ORAL
  Filled 2022-09-08: qty 1

## 2022-09-08 MED ORDER — DILTIAZEM HCL ER COATED BEADS 180 MG PO CP24
180.0000 mg | ORAL_CAPSULE | Freq: Every day | ORAL | Status: DC
  Administered 2022-09-08: 180 mg via ORAL
  Filled 2022-09-08: qty 1

## 2022-09-08 MED ORDER — SOTALOL HCL 120 MG PO TABS: 120.0000 mg | ORAL_TABLET | Freq: Two times a day (BID) | ORAL | 0 refills | Status: DC

## 2022-09-08 MED ORDER — LORATADINE 10 MG PO TABS
10.0000 mg | ORAL_TABLET | Freq: Every day | ORAL | Status: DC | PRN
  Administered 2022-09-08: 10 mg via ORAL
  Filled 2022-09-08: qty 1

## 2022-09-08 MED ORDER — DOXYCYCLINE HYCLATE 100 MG PO TABS
100.0000 mg | ORAL_TABLET | Freq: Two times a day (BID) | ORAL | Status: DC
  Administered 2022-09-08 (×2): 100 mg via ORAL
  Filled 2022-09-08 (×2): qty 1

## 2022-09-08 MED ORDER — DILTIAZEM HCL ER COATED BEADS 180 MG PO CP24: 180.0000 mg | ORAL_CAPSULE | Freq: Every day | ORAL | 0 refills | Status: DC

## 2022-09-08 MED ORDER — ALUM & MAG HYDROXIDE-SIMETH 200-200-20 MG/5ML PO SUSP
30.0000 mL | ORAL | Status: DC | PRN
  Administered 2022-09-08: 30 mL via ORAL
  Filled 2022-09-08: qty 30

## 2022-09-08 NOTE — Progress Notes (Signed)
Subjective:  Still has cough. No other symptoms.   Intake/Output from previous day:  I/O last 3 completed shifts: In: 917.9 [P.O.:360; I.V.:557.9] Out: 200 [Urine:200] No intake/output data recorded. Net IO Since Admission: 717.92 mL [09/08/22 0908]  Blood pressure (!) 132/96, pulse (!) 127, temperature 98.2 F (36.8 C), temperature source Oral, resp. rate 18, height _0  (1.702 m), weight 81.6 kg, SpO2 97 %. Physical Exam Neck:     Vascular: No carotid bruit or JVD.  Cardiovascular:     Rate and Rhythm: Tachycardia present. Rhythm irregularly irregular.     Pulses: Normal pulses and intact distal pulses.     Heart sounds: No murmur heard. Pulmonary:     Effort: Pulmonary effort is normal.     Breath sounds: Normal breath sounds.  Abdominal:     General: Bowel sounds are normal.     Palpations: Abdomen is soft.  Musculoskeletal:     Right lower leg: No edema.     Left lower leg: No edema.  Skin:    Capillary Refill: Capillary refill takes less than 2 seconds.     Lab Results: Lab Results  Component Value Date   NA 137 09/08/2022   K 4.3 09/08/2022   CO2 27 09/08/2022   GLUCOSE 122 (H) 09/08/2022   BUN 12 09/08/2022   CREATININE 0.81 09/08/2022   CALCIUM 8.8 (L) 09/08/2022   GFRNONAA >60 09/08/2022    BNP (last 3 results) Recent Labs    09/06/22 2000 09/08/22 0348  BNP 274.5* 220.5*    ProBNP (last 3 results) No results for input(s): "PROBNP" in the last 8760 hours.    Latest Ref Rng & Units 09/08/2022    3:48 AM 09/07/2022    7:30 AM 09/06/2022    7:27 PM  BMP  Glucose 70 - 99 mg/dL 122  106  107   BUN 8 - 23 mg/dL _1 Creatinine 0.61 - 1.24 mg/dL 0.81  0.87  0.95   Sodium 135 - 145 mmol/L 137  134  137   Potassium 3.5 - 5.1 mmol/L 4.3  3.8  4.0   Chloride 98 - 111 mmol/L 100  98  100   CO2 22 - 32 mmol/L _2 Calcium 8.9 - 10.3 mg/dL 8.8  8.3  8.6       Latest Ref Rng & Units 09/08/2022    3:48 AM 09/07/2022    7:30 AM  09/06/2022    7:27 PM  Hepatic Function  Total Protein 6.5 - 8.1 g/dL 6.1  6.3  6.8   Albumin 3.5 - 5.0 g/dL 3.0  3.1  3.6   AST 15 - 41 U/L 35  42  46   ALT 0 - 44 U/L 46  49  54   Alk Phosphatase 38 - 126 U/L 99  108  116   Total Bilirubin 0.3 - 1.2 mg/dL 0.8  0.9  1.3       Latest Ref Rng & Units 09/08/2022    3:48 AM 09/07/2022    6:00 AM 09/06/2022    7:27 PM  CBC  WBC 4.0 - 10.5 K/uL 7.2  6.9  7.6   Hemoglobin 13.0 - 17.0 g/dL 12.0  10.9  12.1   Hematocrit 39.0 - 52.0 % 35.6  32.6  36.2   Platelets 150 - 400 K/uL 252  213  208    Lipid Panel     Component Value Date/Time  CHOL 162 09/07/2022 0730   TRIG 35 09/07/2022 0730   HDL 64 09/07/2022 0730   CHOLHDL 2.5 09/07/2022 0730   VLDL 7 09/07/2022 0730   LDLCALC 91 09/07/2022 0730   HEMOGLOBIN A1C Lab Results  Component Value Date   HGBA1C 5.8 (H) 09/07/2022   MPG 120 09/07/2022   TSH Recent Labs    09/06/22 2200  TSH 0.795     Radiology:    [facility] Chest Port 1 View 09/06/2022 Mild cardiomegaly. Bronchitic changes which appear chronic. No acute airspace disease, pleural effusion or pneumothorax IMPRESSION: No active disease. Chronic bronchitic changes. Mild cardiomegaly.     CT angiogram chest 09/07/2022: 1. No evidence of pulmonary embolism.  2. A 2.1 cm x 1.6 cm x 1.6 cm left lower lobe lung nodule versus focal airspace disease. While this may be infectious in etiology, the presence of an underlying neoplastic process cannot be excluded. Consider one of the following in 3 months for both low-risk and high-risk individual 3. Moderate severity diffuse bilateral peribronchial thickening which may represent sequelae associated with acute or chronic bronchitis.  4. Mild coronary artery calcification.  5. Mild AP window and pretracheal lymphadenopathy which may be reactive in nature.  6. Aortic atherosclerosis.   Cardiac Studies:    Echocardiogram 09/07/2022:  1. Left ventricular ejection fraction, by  estimation, is 40 to 45%. The left ventricle has mildly decreased function. The left ventricle demonstrates global hypokinesis. Left ventricular diastolic parameters are indeterminate.  2. Right ventricular systolic function is normal. The right ventricular size is normal. There is mildly elevated pulmonary artery systolic pressure.  Mild TR, PASP 43 mmHg.  3. Left atrial size was severely dilated.  4. Right atrial size was severely dilated.  5. No evidence of mitral valve regurgitation.  6. The aortic valve was not well visualized. Aortic valve regurgitation is not visualized.  7. The inferior vena cava is dilated in size with <50% respiratory variability, suggesting right atrial pressure of 15 mmHg.   Lower extremity venous duplex 09/07/2022: No evidence of DVT.  No cystic structures noted in the popliteal fossa.   EKG:   EKG 09/06/2022: Atypical atrial flutter/atrial fibrillation with rapid ventricular sponsor rate of 152 bpm, incomplete right bundle branch block.  Nonspecific T abnormality.  No prior EKG to compare.  Scheduled Meds:  apixaban  5 mg Oral BID   diltiazem  180 mg Oral Daily   sotalol  120 mg Oral Q12H   tamsulosin  0.4 mg Oral Daily   Continuous Infusions: PRN Meds:.alum & mag hydroxide-simeth, benzonatate, chlorpheniramine-HYDROcodone, guaiFENesin, levalbuterol, loratadine, melatonin, metoprolol tartrate  Assessment  Stephen Cortez is a 71 y.o. male patient with no significant prior cardiovascular history admitted with cough and shortness of breath initially seen in an urgent care and in view of elevated heart rate was sent over to the emergency room. Patient now being treated for possible pneumonia and also new onset atrial flutter with RVR/atrial fibrillation.   1.  Atypical atrial flutter with variable AV conduction CHA2DS2-VASc Score is 2.  Yearly risk of stroke: 2.3% (A, +/_ CHF).  Score of 1=0.6; 2=2.2; 3=3.2; 4=4.8; 5=7.2; 6=9.8; 7=>9.8) -(CHF; HTN; vasc  disease DM,  Male = 1; Age <65 =0; 65-74 = 1,  >75 =2; stroke/embolism= 2).   2.  Acute on chronic diastolic heart failure, LVEF 40 to 45% with global hypokinesis. 3.  Mild to moderate pulmonary hypertension secondary to #2. 4.  Abnormal LFTs, probably related to acute congestive liver versus  part of systemic inflammation last month 5.  Acute bronchitis with CT scan revealing diffuse infiltrates.  No evidence to suggest heart failure that is gross.  Plan:   Changing to PO Dilt. HR much improved with Sotolol and patient feeling better. BP is stable.   I have taken the liberty of adding doxycycline for possible atypical pneumonia, avoided Erhtyromycin for possible QTc prolongation (will check with pharmacy).   Leg edema has resolved and no signs of inflammation. Can be discharged home today later after PM dose sotalol if stable. As A. Fib is not the issue that brought him here, I will see him in the office within a week    [name], MD, [facility] 09/08/2022, 9:08 AM Office: [phone] Fax: [phone] Pager: [phone]

## 2022-09-08 NOTE — Care Management (Signed)
  Transition of Care [facility]) Screening Note   Patient Details  Name: Stephen Cortez Date of Birth: [birth year]   Transition of Care [facility]) CM/SW Contact:    Bethena Roys, RN Phone Number: 09/08/2022, 11:56 AM    Transition of Care Department [facility]) has reviewed the patient and no TOC needs have been identified at this time. Benefits check has been completed for Eliquis. Case Manager  will continue to monitor patient advancement through interdisciplinary progression rounds. If new patient transition needs arise, please place a TOC consult.

## 2022-09-08 NOTE — TOC Benefit Eligibility Note (Signed)
Patient Stephen Cortez, English as a foreign language completed.    The patient is currently admitted and upon discharge could be taking Eliquis 5 mg.  The current 30 day co-pay is $37.00.   The patient is insured through [employer], [place] Patient Advocate Specialist [place] Patient Advocate Team Direct Number: [phone]  Fax: [phone]

## 2022-09-08 NOTE — Discharge Summary (Signed)
Physician Discharge Summary  [name] IDP:[id number] DOB: June 11, 1951 DOA: 09/06/2022  PCP: Horald Pollen, MD  Admit date: 09/06/2022 Discharge date: 09/08/2022  Time spent: 40 minutes  Recommendations for Outpatient Follow-up:  Follow outpatient CBC/CMP  Follow RVR with cardiology outpatient, started on sotalol, diltiazem, eliquis Follow LLL pulmonary nodule outpatient - needs repeat CT vs PET-CT vs tissue sampling    Discharge Diagnoses:  Principal Problem:   Atrial fibrillation with rapid ventricular response (Stephen Cortez) Active Problems:   CHF (congestive heart failure) (HCC)   Chronic bronchitis (HCC)   Normocytic anemia   Transaminitis   Atrial fibrillation with RVR ([place])   Discharge Condition: stable  Diet recommendation: heart healthy  Filed Weights   09/06/22 1938  Weight: 81.6 kg    History of present illness:  Stephen Cortez is Stephen Cortez 71 y.o. male with medical history significant of pneumonia, back pain seen urgent care today for cough and nasal congestion.  Noted to be tachycardic and EKG concerning for Skanda Worlds-fib versus flutter and sent to the ED for further evaluation.  Rate in the 130s to 180s with EMS and was given Cardizem 10 mg x 2 during transport.  On arrival to the ED, noted to be in Anavey Coombes-fib with RVR with rate in the 140s to 150s.  Labs showing no leukocytosis, hemoglobin 12.1, MCV 93.3, AST 46, ALT 54, alk phos 116, T. bili 1.3, troponin negative, COVID and influenza PCR negative, magnesium 2.0, BNP 274.  Chest x-ray showing chronic bronchitic changes, mild cardiomegaly, and no active disease. Patient was given IV Cardizem 20 mg and started on continuous infusion.  He is also given 1 L normal saline bolus.  TRH called to admit.   Patient reports 7 to 8-week history of cough and congestion.  For the past 1 day he has noticed that his left leg is swollen and his heart is racing.  He denies history of Melina Mosteller-fib or any cardiac problems.  Denies history of recent  travel, surgeries, or blood clots.  Denies chest pain.  Denies fevers, vomiting, diarrhea, or abdominal pain.  Denies alcohol use.   He was admitted for new onset afib with RVR.  Found to have slightly decreased EF.  Seen by cardiology recommending diltiazem, sotalol.  He's discharging today with plans for outpatient follow up.  Being treated with doxycycline for concern for bronchitis.  Discharging on eliquis as well.   Hospital Course:  Assessment and Plan: New onset Stephen Cortez-fib with RVR - rates improved, but still rapid at times, per cardiology - TSH wnl, CT PE protocol without PE - does sound like he has an upper respiratory infection which could be contributing factor, but underlying risk factor for afib unclear at this point (imaging concerning for acute or chronic bronchitis, ?chronic pulm dz).   - diltiazem per cardiology - chadvasc is 2 (age and HF).  eliquis, I discussed with him cost which he noted was ok. - echo with EF 40-45%, mildly decreased function, global hypokinesis - will consult cardiology, appreciate assistance -> recommending sotalol, PO dilt.  Ok with discharge this PM based on results of home walk test (despite persistent mild tachy from 110-127, patient was asymptomatic).  Plan for outpatient cardiology follow up.   HFmrEF EF 40-45%, see report Appears euvolemic, follow with Dr. Einar Cortez outpatient    Diffuse Bilateral Peribronchial Thickening Acute or chronic bronchitis Trial of doxycycline   Left Lower Lobe Nodule vs Focal Airspace Disease - in setting of upper respiratory symptoms, possibly infectious -  discussed need for follow up within 3 months with repeat chest CT, PET CT or tissue sampling    Mild normocytic anemia Follow outpatient   Mild transaminitis Improved, mild, follow outpatient     Procedures: Echo IMPRESSIONS     1. Left ventricular ejection fraction, by estimation, is 40 to 45%. The  left ventricle has mildly decreased function. The left  ventricle  demonstrates global hypokinesis. Left ventricular diastolic parameters are  indeterminate.   2. Right ventricular systolic function is normal. The right ventricular  size is normal. There is mildly elevated pulmonary artery systolic  pressure.   3. Left atrial size was severely dilated.   4. Right atrial size was severely dilated.   5. No evidence of mitral valve regurgitation.   6. The aortic valve was not well visualized. Aortic valve regurgitation  is not visualized.   7. The inferior vena cava is dilated in size with <50% respiratory  variability, suggesting right atrial pressure of 15 mmHg.   Comparison(s): No prior Echocardiogram.   LE [place] Summary:  RIGHT:  - No evidence of common femoral vein obstruction.    LEFT:  - There is no evidence of deep vein thrombosis in the lower extremity.    - No cystic structure found in the popliteal fossa.     Consultations: cardiology  Discharge Exam: Vitals:   09/08/22 1607 09/08/22 1700  BP: 126/78   Pulse: (!) 125 (!) 128  Resp: 16   Temp: 97.8 F (36.6 C)   SpO2: 97% 97%   Eager to discharge Discussed with wife at bedside  General: No acute distress. Cardiovascular: Heart sounds show Atul Delucia regular rate, and rhythm.  Lungs: Clear to auscultation bilaterally Abdomen: Soft, nontender, nondistended Neurological: Alert and oriented 3. Moves all extremities 4 with equal strength. Cranial nerves II through XII grossly intact. Extremities: No clubbing or cyanosis. No edema  Discharge Instructions   Discharge Instructions     Call MD for:  difficulty breathing, headache or visual disturbances   Complete by: As directed    Call MD for:  extreme fatigue   Complete by: As directed    Call MD for:  hives   Complete by: As directed    Call MD for:  persistant dizziness or light-headedness   Complete by: As directed    Call MD for:  persistant nausea and vomiting   Complete by: As directed    Call MD for:  redness,  tenderness, or signs of infection (pain, swelling, redness, odor or green/yellow discharge around incision site)   Complete by: As directed    Call MD for:  severe uncontrolled pain   Complete by: As directed    Call MD for:  temperature >100.4   Complete by: As directed    Diet - low sodium heart healthy   Complete by: As directed    Discharge instructions   Complete by: As directed    You were seen for new onset atrial fibrillation.   You have been started on several new medicines.  You were started on Dmoni Fortson medicine called eliquis which helps prevent stroke in patients with atrial fibrillation.  We also sent you with prescriptions for sotalol and diltiazem to help with your heart rate.  Please follow up with Dr. Einar Cortez as scheduled for further adjustments.  Your ejection fraction (squeeze or pump) of your heart was mildly low.  Follow this up with Dr. Einar Cortez for additional follow up instructions.   You were started on doxycycline  for possible bronchitis.    Your CT scan was abnormal with Terriona Horlacher left lower lobe pulmonary nodule.  This needs to be followed up within 3 months with either Chevelle Coulson repeat chest CT, Christyl Osentoski follow up PET-CT, or tissue sampling.  Please discuss this with your PCP so they can arrange the appropriate follow up.  Your heart rate is still elevated at the time of discharge, you'll need to follow up with Dr. Einar Cortez for further adjustments in your medicines.  Return for new, recurrent, or worsening symptoms.  Please ask your PCP to request records from this hospitalization so they know what was done and what the next steps will be.   Increase activity slowly   Complete by: As directed       Allergies as of 09/08/2022   No Known Allergies      Medication List     TAKE these medications    apixaban 5 MG Tabs tablet Commonly known as: ELIQUIS Take 1 tablet (5 mg total) by mouth 2 (two) times daily.   diltiazem 180 MG 24 hr capsule Commonly known as: CARDIZEM CD Take 1 capsule  (180 mg total) by mouth daily. Start taking on: September 09, 2022   doxycycline 100 MG tablet Commonly known as: VIBRA-TABS Take 1 tablet (100 mg total) by mouth every 12 (twelve) hours for 5 days.   sotalol 120 MG tablet Commonly known as: BETAPACE Take 1 tablet (120 mg total) by mouth 2 (two) times daily. Start taking on: September 09, 2022   tamsulosin 0.4 MG Caps capsule Commonly known as: FLOMAX Take 0.4 mg by mouth daily.       No Known Allergies  Follow-up Information     [name], MD. Call.   Specialty: Cardiology Why: To be seen in 1 week with one of [place] in the practice. I have sent message to call you also Contact information: [place] [address] [phone]         Horald Pollen, MD Follow up.   Specialty: Internal Medicine Why: call for Ross Hefferan follow up appointment please discuss your plan for follow up of the pulmonary nodule Contact information: [place] [address] [phone]                  The results of significant diagnostics from this hospitalization (including imaging, microbiology, ancillary and laboratory) are listed below for reference.    Significant Diagnostic Studies: VAS [place] LOWER EXTREMITY VENOUS (DVT) (7a-7p)  Result Date: 09/07/2022  Lower Venous DVT Study Patient Name:  ZEBULEN SIMONIS  Date of Exam:   09/07/2022 Medical Rec #: [id number]         Accession #:    [id number] Date of Birth: June 09, 1951         Patient Gender: M Patient Age:   20 years Exam Location:  [facility] Procedure:      VAS [place] LOWER EXTREMITY VENOUS (DVT) Referring Phys: Wandra Feinstein RATHORE --------------------------------------------------------------------------------  Indications: Left lower extremity new onset swelling, history of pneumonia.  Comparison Study: No prior studies. Performing Technologist: Darlin Coco RDMS, RVT  Examination Guidelines: Barbette Mcglaun complete evaluation includes B-mode imaging,  spectral Doppler, color Doppler, and power Doppler as needed of all accessible portions of each vessel. Bilateral testing is considered an integral part of Analyce Tavares complete examination. Limited examinations for reoccurring indications may be performed as noted. The reflux portion of the exam is performed with the patient in reverse Trendelenburg.  +-----+---------------+---------+-----------+-------------------+--------------+  RIGHTCompressibilityPhasicitySpontaneityProperties         Thrombus Aging +-----+---------------+---------+-----------+-------------------+--------------+ CFV  Full           No       Yes        Pulsatile venous                                                          waveforms                         +-----+---------------+---------+-----------+-------------------+--------------+   +---------+---------------+---------+-----------+---------------+--------------+ LEFT     CompressibilityPhasicitySpontaneityProperties     Thrombus Aging +---------+---------------+---------+-----------+---------------+--------------+ CFV      Full           No       Yes        Pulsatile                                                                 venous                                                                    waveforms                     +---------+---------------+---------+-----------+---------------+--------------+ SFJ      Full                                                             +---------+---------------+---------+-----------+---------------+--------------+ FV Prox  Full                                                             +---------+---------------+---------+-----------+---------------+--------------+ FV Mid   Full                                                             +---------+---------------+---------+-----------+---------------+--------------+ FV DistalFull                                                              +---------+---------------+---------+-----------+---------------+--------------+ PFV      Full                                                             +---------+---------------+---------+-----------+---------------+--------------+  POP      Full           No       Yes                                      +---------+---------------+---------+-----------+---------------+--------------+ PTV      Full                                                             +---------+---------------+---------+-----------+---------------+--------------+ PERO     Full                                                             +---------+---------------+---------+-----------+---------------+--------------+ Gastroc  Full                                                             +---------+---------------+---------+-----------+---------------+--------------+     Summary: RIGHT: - No evidence of common femoral vein obstruction.  LEFT: - There is no evidence of deep vein thrombosis in the lower extremity.  - No cystic structure found in the popliteal fossa.  *See table(s) above for measurements and observations. Electronically signed by Harold Barban MD on 09/07/2022 at 7:49:35 PM.    Final    ECHOCARDIOGRAM COMPLETE  Result Date: 09/07/2022    ECHOCARDIOGRAM REPORT   Patient Name:   DEJAY KRONK Date of Exam: 09/07/2022 Medical Rec #:  [id number]        Height:       67.0 in Accession #:    [id number]       Weight:       180.0 lb Date of Birth:  1951/05/19        BSA:          1.934 m Patient Age:    6 years         BP:           112/82 mmHg Patient Gender: M                HR:           140 bpm. Exam Location:  Inpatient Procedure: 2D Echo, Color Doppler and Cardiac Doppler Indications:    I48.91* Unspecified atrial fibrillation; Z22.48 Acute systolic                 (congestive) heart failure  History:        Patient has no prior history of Echocardiogram examinations.                  CHF; Arrythmias:Atrial Fibrillation.  Sonographer:    Raquel Sarna Senior RDCS Referring Phys: [id number] [place]  1. Left ventricular ejection fraction, by estimation, is 40 to 45%. The left ventricle has mildly decreased function. The left ventricle demonstrates global hypokinesis. Left ventricular diastolic parameters are indeterminate.  2. Right ventricular systolic function is normal. The right ventricular size is normal. There is mildly elevated pulmonary artery systolic pressure.  3. Left atrial size was severely dilated.  4. Right atrial size was severely dilated.  5. No evidence of mitral valve regurgitation.  6. The aortic valve was not well visualized. Aortic valve regurgitation is not visualized.  7. The inferior vena cava is dilated in size with <50% respiratory variability, suggesting right atrial pressure of 15 mmHg. Comparison(s): No prior Echocardiogram. FINDINGS  Left Ventricle: Wall motion challenging in the setting of atrial fibrillation. windows are challenging. Left ventricular ejection fraction, by estimation, is 40 to 45%. The left ventricle has mildly decreased function. The left ventricle demonstrates global hypokinesis. The left ventricular internal cavity size was normal in size. There is no left ventricular hypertrophy. Left ventricular diastolic parameters are indeterminate. Right Ventricle: The right ventricular size is normal. Right ventricular systolic function is normal. There is mildly elevated pulmonary artery systolic pressure. The tricuspid regurgitant velocity is 2.65 m/s, and with an assumed right atrial pressure of 15 mmHg, the estimated right ventricular systolic pressure is 08.6 mmHg. Left Atrium: Left atrial size was severely dilated. Right Atrium: Right atrial size was severely dilated. Pericardium: There is no evidence of pericardial effusion. Mitral Valve: No evidence of mitral valve regurgitation. Tricuspid Valve: Tricuspid valve  regurgitation is mild. Aortic Valve: The aortic valve was not well visualized. Aortic valve regurgitation is not visualized. Pulmonic Valve: Pulmonic valve regurgitation is not visualized. Aorta: The aortic root and ascending aorta are structurally normal, with no evidence of dilitation. Venous: The inferior vena cava is dilated in size with less than 50% respiratory variability, suggesting right atrial pressure of 15 mmHg. IAS/Shunts: No atrial level shunt detected by color flow Doppler.  LEFT VENTRICLE PLAX 2D LVIDd:         5.30 cm      Diastology LVIDs:         4.20 cm      LV e' medial:  9.25 cm/s LV PW:         0.80 cm      LV e' lateral: 11.60 cm/s LV IVS:        0.80 cm LVOT diam:     1.90 cm LV SV:         47 LV SV Index:   24 LVOT Area:     2.84 cm  LV Volumes (MOD) LV vol d, MOD A2C: 96.2 ml LV vol d, MOD A4C: 103.0 ml LV vol s, MOD A2C: 54.3 ml LV vol s, MOD A4C: 65.5 ml LV SV MOD A2C:     41.9 ml LV SV MOD A4C:     103.0 ml LV SV MOD BP:      41.7 ml RIGHT VENTRICLE RV S prime:     11.20 cm/s TAPSE (M-mode): 1.7 cm LEFT ATRIUM             Index        RIGHT ATRIUM           Index LA diam:        4.50 cm 2.33 cm/m   RA Area:     21.40 cm LA Vol (A2C):   66.4 ml 34.34 ml/m  RA Volume:   59.80 ml  30.93 ml/m LA Vol (A4C):   97.8 ml 50.58 ml/m LA Biplane Vol: 82.8 ml 42.82 ml/m  AORTIC VALVE LVOT Vmax:   106.00 cm/s LVOT Vmean:  82.200 cm/s LVOT  VTI:    0.167 m  AORTA Ao Root diam: 3.30 cm TRICUSPID VALVE TR Peak grad:   28.1 mmHg TR Vmax:        265.00 cm/s  SHUNTS Systemic VTI:  0.17 m Systemic Diam: 1.90 cm Phineas Inches Electronically signed by Phineas Inches Signature Date/Time: 09/07/2022/10:40:34 AM    Final    CT Angio Chest Pulmonary Embolism (PE) W or WO Contrast  Result Date: 09/07/2022 CLINICAL DATA:  Chest pain. EXAM: CT ANGIOGRAPHY CHEST WITH CONTRAST TECHNIQUE: Multidetector CT imaging of the chest was performed using the standard protocol during bolus administration of intravenous  contrast. Multiplanar CT image reconstructions and MIPs were obtained to evaluate the vascular anatomy. RADIATION DOSE REDUCTION: This exam was performed according to the departmental dose-optimization program which includes automated exposure control, adjustment of the mA and/or kV according to patient size and/or use of iterative reconstruction technique. CONTRAST:  62m OMNIPAQUE IOHEXOL 350 MG/ML SOLN COMPARISON:  None Available. FINDINGS: Cardiovascular: There is mild calcification of the aortic arch, without evidence of aortic aneurysm. Satisfactory opacification of the pulmonary arteries to the segmental level. No evidence of pulmonary embolism. Normal heart size with mild coronary artery calcification. No pericardial effusion. Mediastinum/Nodes: There is mild AP window and pretracheal lymphadenopathy. Thyroid gland, trachea, and esophagus demonstrate no significant findings. Lungs/Pleura: Ladeidra Borys 2.1 cm x 1.6 cm x 1.6 cm lung nodule versus focal airspace disease is seen within the posteromedial aspect of the left lower lobe (axial CT image 73, CT series 6). Mild atelectatic changes are seen within the lingular region, right middle lobe and bilateral lung bases. Moderate severity diffuse bilateral peribronchial thickening is noted. There is no evidence of Nykayla Marcelli pleural effusion or pneumothorax. Upper Abdomen: No acute abnormality. Musculoskeletal: Evianna Chandran 7 mm benign-appearing sclerotic focus is seen within the anterior aspect of the T3 vertebral body. Multilevel degenerative changes seen throughout the thoracic spine. Review of the MIP images confirms the above findings. IMPRESSION: 1. No evidence of pulmonary embolism. 2. 2.1 cm x 1.6 cm x 1.6 cm left lower lobe lung nodule versus focal airspace disease. While this may be infectious in etiology, the presence of an underlying neoplastic process cannot be excluded. Consider one of the following in 3 months for both low-risk and high-risk individuals: (Kayti Poss) repeat chest CT, (b)  follow-up PET-CT, or (c) tissue sampling. This recommendation follows the consensus statement: Guidelines for Management of Incidental Pulmonary Nodules Detected on CT Images: From the Fleischner Society 2017; Radiology 2017; 284:228-243. 3. Moderate severity diffuse bilateral peribronchial thickening which may represent sequelae associated with acute or chronic bronchitis. 4. Mild coronary artery calcification. 5. Mild AP window and pretracheal lymphadenopathy which may be reactive in nature. 6. Aortic atherosclerosis. Aortic Atherosclerosis (ICD10-I70.0). Electronically Signed   By: TVirgina NorfolkM.D.   On: 09/07/2022 00:26   DG Chest Port 1 View  Result Date: 09/06/2022 CLINICAL DATA:  Atrial fibrillation EXAM: PORTABLE CHEST 1 VIEW COMPARISON:  02/09/2021 FINDINGS: Mild cardiomegaly. Bronchitic changes which appear chronic. No acute airspace disease, pleural effusion or pneumothorax IMPRESSION: No active disease. Chronic bronchitic changes. Mild cardiomegaly. Electronically Signed   By: KDonavan FoilM.D.   On: 09/06/2022 20:27    Microbiology: Recent Results (from the past 240 hour(s))  Resp Panel by RT-PCR (Flu Blayde Bacigalupi&B, Covid) Anterior Nasal Swab     Status: None   Collection Time: 09/06/22  7:27 PM   Specimen: Anterior Nasal Swab  Result Value Ref Range Status   SARS Coronavirus 2 by RT PCR  NEGATIVE NEGATIVE Final    Comment: (NOTE) SARS-CoV-2 target nucleic acids are NOT DETECTED.  The SARS-CoV-2 RNA is generally detectable in upper respiratory specimens during the acute phase of infection. The lowest concentration of SARS-CoV-2 viral copies this assay can detect is 138 copies/mL. Jassica Zazueta negative result does not preclude SARS-Cov-2 infection and should not be used as the sole basis for treatment or other patient management decisions. Shaynah Hund negative result may occur with  improper specimen collection/handling, submission of specimen other than nasopharyngeal swab, presence of viral mutation(s)  within the areas targeted by this assay, and inadequate number of viral copies(<138 copies/mL). Abiha Lukehart negative result must be combined with clinical observations, patient history, and epidemiological information. The expected result is Negative.  Fact Sheet for Patients:  EntrepreneurPulse.com.au  Fact Sheet for Healthcare Providers:  IncredibleEmployment.be  This test is no t yet approved or cleared by the [place] FDA and  has been authorized for detection and/or diagnosis of SARS-CoV-2 by FDA under an Emergency Use Authorization (EUA). This EUA will remain  in effect (meaning this test can be used) for the duration of the COVID-19 declaration under Section 564(b)(1) of the Act, 21 U.S.C.section 360bbb-3(b)(1), unless the authorization is terminated  or revoked sooner.       Influenza Santiago Graf by PCR NEGATIVE NEGATIVE Final   Influenza B by PCR NEGATIVE NEGATIVE Final    Comment: (NOTE) The Xpert Xpress SARS-CoV-2/FLU/RSV plus assay is intended as an aid in the diagnosis of influenza from Nasopharyngeal swab specimens and should not be used as Sausha Raymond sole basis for treatment. Nasal washings and aspirates are unacceptable for Xpert Xpress SARS-CoV-2/FLU/RSV testing.  Fact Sheet for Patients: EntrepreneurPulse.com.au  Fact Sheet for Healthcare Providers: IncredibleEmployment.be  This test is not yet approved or cleared by the [place] FDA and has been authorized for detection and/or diagnosis of SARS-CoV-2 by FDA under an Emergency Use Authorization (EUA). This EUA will remain in effect (meaning this test can be used) for the duration of the COVID-19 declaration under Section 564(b)(1) of the Act, 21 U.S.C. section 360bbb-3(b)(1), unless the authorization is terminated or revoked.  Performed at [facility], [place] [address] [place], [place] [address]      Labs: Basic Metabolic Panel: Recent Labs   Lab 09/06/22 1927 09/07/22 0730 09/08/22 0348  NA 137 134* 137  K 4.0 3.8 4.3  CL 100 98 100  CO2 _0 GLUCOSE 107* 106* 122*  BUN _1 CREATININE 0.95 0.87 0.81  CALCIUM 8.6* 8.3* 8.8*  MG 2.0  --  2.1   Liver Function Tests: Recent Labs  Lab 09/06/22 1927 09/07/22 0730 09/08/22 0348  AST 46* 42* 35  ALT 54* 49* 46*  ALKPHOS 116 108 99  BILITOT 1.3* 0.9 0.8  PROT 6.8 6.3* 6.1*  ALBUMIN 3.6 3.1* 3.0*   No results for input(s): "LIPASE", "AMYLASE" in the last 168 hours. No results for input(s): "AMMONIA" in the last 168 hours. CBC: Recent Labs  Lab 09/06/22 1927 09/07/22 0600 09/08/22 0348  WBC 7.6 6.9 7.2  NEUTROABS 5.3  --   --   HGB 12.1* 10.9* 12.0*  HCT 36.2* 32.6* 35.6*  MCV 93.3 92.9 92.2  PLT 208 213 252   Cardiac Enzymes: No results for input(s): "CKTOTAL", "CKMB", "CKMBINDEX", "TROPONINI" in the last 168 hours. BNP: BNP (last 3 results) Recent Labs    09/06/22 2000 09/08/22 0348  BNP 274.5* 220.5*    ProBNP (last 3 results) No results  for input(s): "PROBNP" in the last 8760 hours.  CBG: Recent Labs  Lab 09/08/22 0822  GLUCAP 118*       Signed:  Fayrene Helper MD.  [facility] 09/08/2022, 6:18 PM

## 2022-09-08 NOTE — Discharge Instructions (Addendum)

## 2022-09-08 NOTE — Progress Notes (Signed)
Ambulated in hallway x 100 feet. Tolerated well. No c/o SOB, weakness, dizziness or other. HR in AFib remained 110 - 127 during walk and O2 sats = 95 - 97 % on RA.

## 2022-09-09 ENCOUNTER — Telehealth: Payer: Self-pay

## 2022-09-09 NOTE — Telephone Encounter (Signed)
Location of hospitalization:  Reason for hospitalization: A fib Date of discharge: 09/08/2022 Date of first communication with patient: today Person contacting patient: Me Current symptoms: NA Do you understand why you were in the Hospital: Yes Questions regarding discharge instructions: None Where were you discharged to: Home Medications reviewed: Yes Allergies reviewed: Yes Dietary changes reviewed: Yes. Discussed low fat and low salt diet.  Referals reviewed: NA Activities of Daily Living: Able to with mild limitations Any transportation issues/concerns: None Any patient concerns: None Confirmed importance & date/time of Follow up appt: Yes Confirmed with patient if condition begins to worsen call. Pt was given the office number and encouraged to call back with questions or concerns: Yes

## 2022-09-12 ENCOUNTER — Telehealth: Payer: Self-pay

## 2022-09-12 NOTE — Telephone Encounter (Signed)
Transition Care Management Unsuccessful Follow-up Telephone Call  Date of discharge and from where:  [place] 09-08-22 Dx: A-fib with RVR  Attempts:  1st Attempt  Reason for unsuccessful TCM follow-up call:  Left voice message   Juanda Crumble LPN [place] Direct Dial [phone]

## 2022-09-13 NOTE — Telephone Encounter (Signed)
Transition Care Management Unsuccessful Follow-up Telephone Call  Date of discharge and from where:    [place] 09-08-22 Dx: A-fib with RVR    Attempts:  2nd Attempt  Reason for unsuccessful TCM follow-up call:  Left voice message   Juanda Crumble LPN [place] Direct Dial [phone]

## 2022-09-14 NOTE — Telephone Encounter (Signed)
Transition Care Management Unsuccessful Follow-up Telephone Call  Date of discharge and from where:  [place] 09-08-22 Dx: A-fib with RVR   Attempts:  3rd Attempt  Reason for unsuccessful TCM follow-up call:  Left voice message   Juanda Crumble LPN [place] Direct Dial [phone]

## 2022-09-16 ENCOUNTER — Ambulatory Visit: Payer: Medicare Other | Admitting: Cardiology

## 2022-09-16 ENCOUNTER — Ambulatory Visit: Payer: Medicare Other | Admitting: Internal Medicine

## 2022-09-16 VITALS — BP 138/107 | HR 111 | Ht 67.0 in | Wt 191.4 lb

## 2022-09-16 DIAGNOSIS — I4891 Unspecified atrial fibrillation: Secondary | ICD-10-CM

## 2022-09-16 DIAGNOSIS — I5022 Chronic systolic (congestive) heart failure: Secondary | ICD-10-CM

## 2022-09-16 DIAGNOSIS — I10 Essential (primary) hypertension: Secondary | ICD-10-CM

## 2022-09-16 MED ORDER — DILTIAZEM HCL ER COATED BEADS 240 MG PO CP24: 240.0000 mg | ORAL_CAPSULE | Freq: Every day | ORAL | 3 refills | Status: DC

## 2022-09-16 MED ORDER — SOTALOL HCL 160 MG PO TABS: 320.0000 mg | ORAL_TABLET | Freq: Two times a day (BID) | ORAL | 6 refills | Status: DC

## 2022-09-16 MED ORDER — METOPROLOL SUCCINATE ER 100 MG PO TB24: 100.0000 mg | ORAL_TABLET | Freq: Two times a day (BID) | ORAL | 3 refills | Status: DC

## 2022-09-19 ENCOUNTER — Telehealth: Payer: Self-pay

## 2022-09-19 DIAGNOSIS — I5022 Chronic systolic (congestive) heart failure: Secondary | ICD-10-CM | POA: Insufficient documentation

## 2022-09-19 DIAGNOSIS — I10 Essential (primary) hypertension: Secondary | ICD-10-CM | POA: Insufficient documentation

## 2022-09-19 NOTE — Telephone Encounter (Signed)
Please call pt's daughter regarding possible SE

## 2022-09-19 NOTE — Progress Notes (Signed)
Primary Physician/Referring:  Horald Pollen, MD  Patient ID: Stephen Cortez, male    DOB: Aug 05, 1951, 71 y.o.   MRN: [id number]  Chief Complaint  Patient presents with   Hospitalization Follow-up        Atrial Fibrillation   HPI:    Stephen Cortez  is a 71 y.o. male with new onset atrial fibrillation who is here to establish care with cardiology as posthospital follow-up.  Patient does not really feel it when he is in atrial fibrillation however he states that sometimes he does feel the irregular heart rates and palpitations.  He does not know if he has ever been in atrial fibrillation before since he is not too symptomatic even at very fast heart rates.  It appears that he may have been in atrial fibrillation for a while given severe biatrial enlargement on his echocardiogram.  Patient denies any cardiac history prior to his most recent hospitalization.  He is tolerating Eliquis without issues, no bleeding diatheses.  He denies chest pain, shortness of breath, diaphoresis, syncope, edema, orthopnea, PND.  No past medical history on file. Past Surgical History:  Procedure Laterality Date   MINOR CARPAL TUNNEL     No family history on file.  Social History   Tobacco Use   Smoking status: Never   Smokeless tobacco: Never  Substance Use Topics   Alcohol use: Not Currently   Marital Status: Married  ROS  Review of Systems  Cardiovascular:  Positive for irregular heartbeat and palpitations.   Objective  Blood pressure (!) 138/107, pulse (!) 111, height '5\' 7"'$  (1.702 m), weight 191 lb 6.4 oz (86.8 kg), SpO2 97 %. Body mass index is 29.98 kg/m.     09/16/2022   10:40 AM 09/16/2022   10:35 AM 09/08/2022    5:00 PM  Vitals with BMI  Height  '5\' 7"'$    Weight  191 lbs 6 oz   BMI  74.08   Systolic 144 818   Diastolic 563 149   Pulse 702 80 128     Physical Exam Vitals reviewed.  HENT:     Head: Normocephalic and atraumatic.  Cardiovascular:     Rate and Rhythm:  Tachycardia present. Rhythm irregular.     Pulses: Normal pulses.     Heart sounds: No murmur heard. Pulmonary:     Effort: Pulmonary effort is normal.  Abdominal:     General: Bowel sounds are normal.  Musculoskeletal:     Right lower leg: No edema.     Left lower leg: No edema.  Skin:    General: Skin is warm and dry.  Neurological:     Mental Status: He is alert.     Medications and allergies  No Known Allergies   Medication list after today's encounter   Current Outpatient Medications:    apixaban (ELIQUIS) 5 MG TABS tablet, Take 1 tablet (5 mg total) by mouth 2 (two) times daily., Disp: 60 tablet, Rfl: 1   metoprolol succinate (TOPROL-XL) 100 MG 24 hr tablet, Take 1 tablet (100 mg total) by mouth in the morning and at bedtime. Take with or immediately following a meal., Disp: 180 tablet, Rfl: 3   tamsulosin (FLOMAX) 0.4 MG CAPS capsule, Take 0.4 mg by mouth daily., Disp: , Rfl:   Laboratory examination:   Lab Results  Component Value Date   NA 137 09/08/2022   K 4.3 09/08/2022   CO2 27 09/08/2022   GLUCOSE 122 (H) 09/08/2022   BUN  12 09/08/2022   CREATININE 0.81 09/08/2022   CALCIUM 8.8 (L) 09/08/2022   GFRNONAA >60 09/08/2022       Latest Ref Rng & Units 09/08/2022    3:48 AM 09/07/2022    7:30 AM 09/06/2022    7:27 PM  CMP  Glucose 70 - 99 mg/dL 122  106  107   BUN 8 - 23 mg/dL '12  10  13   '$ Creatinine 0.61 - 1.24 mg/dL 0.81  0.87  0.95   Sodium 135 - 145 mmol/L 137  134  137   Potassium 3.5 - 5.1 mmol/L 4.3  3.8  4.0   Chloride 98 - 111 mmol/L 100  98  100   CO2 22 - 32 mmol/L '27  26  29   '$ Calcium 8.9 - 10.3 mg/dL 8.8  8.3  8.6   Total Protein 6.5 - 8.1 g/dL 6.1  6.3  6.8   Total Bilirubin 0.3 - 1.2 mg/dL 0.8  0.9  1.3   Alkaline Phos 38 - 126 U/L 99  108  116   AST 15 - 41 U/L 35  42  46   ALT 0 - 44 U/L 46  49  54       Latest Ref Rng & Units 09/08/2022    3:48 AM 09/07/2022    6:00 AM 09/06/2022    7:27 PM  CBC  WBC 4.0 - 10.5 K/uL 7.2  6.9   7.6   Hemoglobin 13.0 - 17.0 g/dL 12.0  10.9  12.1   Hematocrit 39.0 - 52.0 % 35.6  32.6  36.2   Platelets 150 - 400 K/uL 252  213  208     Lipid Panel Recent Labs    09/07/22 0730  CHOL 162  TRIG 35  LDLCALC 91  VLDL 7  HDL 64  CHOLHDL 2.5    HEMOGLOBIN A1C Lab Results  Component Value Date   HGBA1C 5.8 (H) 09/07/2022   MPG 120 09/07/2022   TSH Recent Labs    09/06/22 2200  TSH 0.795    External labs:     Radiology:    Cardiac Studies:   Echocardiogram 09/07/2022:  1. Left ventricular ejection fraction, by estimation, is 40 to 45%. The left ventricle has mildly decreased function. The left ventricle demonstrates global hypokinesis. Left ventricular diastolic parameters are indeterminate.  2. Right ventricular systolic function is normal. The right ventricular size is normal. There is mildly elevated pulmonary artery systolic pressure.  Mild TR, PASP 43 mmHg.  3. Left atrial size was severely dilated.  4. Right atrial size was severely dilated.  5. No evidence of mitral valve regurgitation.  6. The aortic valve was not well visualized. Aortic valve regurgitation is not visualized.  7. The inferior vena cava is dilated in size with <50% respiratory variability, suggesting right atrial pressure of 15 mmHg.   Lower extremity venous duplex 09/07/2022: No evidence of DVT.  No cystic structures noted in the popliteal fossa.   EKG:   EKG 09/06/2022: Atypical atrial flutter/atrial fibrillation with rapid ventricular sponsor rate of 152 bpm, incomplete right bundle branch block.  Nonspecific T abnormality.  No prior EKG to compare.  EKG:   09/16/2022: Atrial fibrillation RVR, iRBBB -Nonspecific T-abnormality.  Assessment     ICD-10-CM   1. Atrial fibrillation with rapid ventricular response (HCC)  I48.91 EKG 12-Lead    2. Essential hypertension  I10     3. Chronic systolic (congestive) heart failure (HCC)  I50.22  Orders Placed This Encounter   Procedures   EKG 12-Lead    Meds ordered this encounter  Medications   DISCONTD: diltiazem (CARDIZEM CD) 240 MG 24 hr capsule    Sig: Take 1 capsule (240 mg total) by mouth daily.    Dispense:  90 capsule    Refill:  3   DISCONTD: sotalol (BETAPACE) 160 MG tablet    Sig: Take 2 tablets (320 mg total) by mouth 2 (two) times daily.    Dispense:  180 tablet    Refill:  6   metoprolol succinate (TOPROL-XL) 100 MG 24 hr tablet    Sig: Take 1 tablet (100 mg total) by mouth in the morning and at bedtime. Take with or immediately following a meal.    Dispense:  180 tablet    Refill:  3    Medications Discontinued During This Encounter  Medication Reason   diltiazem (CARDIZEM CD) 180 MG 24 hr capsule    diltiazem (CARDIZEM CD) 240 MG 24 hr capsule    sotalol (BETAPACE) 120 MG tablet    sotalol (BETAPACE) 160 MG tablet      Recommendations:   Stephen Cortez is a 71 y.o.  male with new onset atrial fibrillation  Atrial fibrillation with rapid ventricular response ([place]) Patient likely has chronic Afib given bi-atrial enlargement on echocardiogram Discontinue sotalol and cardizem Start Toprol XL '100mg'$  BID Continue Eliquis '5mg'$  BID Follow-up in 1 week for BP and HR check   Essential hypertension Encourage low-sodium diet, less than 2000 mg daily.   Chronic systolic (congestive) heart failure ([place]) Continue Toprol XL Patient will need ischemic work-up once HR better controlled Will repeat echo once rate controlled as well      Floydene Flock, DO, [facility]  09/19/2022, 12:42 PM Office: [phone] Pager: [phone]

## 2022-09-19 NOTE — Telephone Encounter (Signed)
What is SE and there is no number for his daughter??

## 2022-09-21 ENCOUNTER — Telehealth: Payer: Self-pay

## 2022-09-21 NOTE — Telephone Encounter (Signed)
Patients daughter is calling with complains that since changing medications to metoprolol twice a day the patient is complaining of shortness of breathe, very fatigued, and just all around does not feel well. The cardizem and sotalol he was previously on, did not cause these same side effects. Daughter is a Marine scientist and feels like his body is not tolerating the metprolol well. She is requesting an appointment, he is going to come in 12/14 @ 1:45

## 2022-09-21 NOTE — Telephone Encounter (Signed)
Ok I won't be here but thanks

## 2022-09-22 ENCOUNTER — Encounter: Payer: Self-pay | Admitting: Cardiology

## 2022-09-22 ENCOUNTER — Ambulatory Visit: Payer: Medicare Other | Admitting: Cardiology

## 2022-09-22 VITALS — BP 105/89 | HR 51 | Resp 16 | Ht 67.0 in | Wt 184.0 lb

## 2022-09-22 DIAGNOSIS — R0609 Other forms of dyspnea: Secondary | ICD-10-CM

## 2022-09-22 DIAGNOSIS — I10 Essential (primary) hypertension: Secondary | ICD-10-CM | POA: Diagnosis not present

## 2022-09-22 DIAGNOSIS — R0683 Snoring: Secondary | ICD-10-CM

## 2022-09-22 DIAGNOSIS — I4819 Other persistent atrial fibrillation: Secondary | ICD-10-CM

## 2022-09-22 DIAGNOSIS — I5022 Chronic systolic (congestive) heart failure: Secondary | ICD-10-CM

## 2022-09-22 MED ORDER — SOTALOL HCL 120 MG PO TABS: 120.0000 mg | ORAL_TABLET | Freq: Two times a day (BID) | ORAL | 1 refills | Status: DC

## 2022-09-22 MED ORDER — AMIODARONE HCL 200 MG PO TABS: 200.0000 mg | ORAL_TABLET | Freq: Two times a day (BID) | ORAL | 0 refills | Status: DC

## 2022-09-22 NOTE — Progress Notes (Signed)
Primary Physician/Referring:  Horald Pollen, MD  Patient ID: BRACE WELTE, male    DOB: December 18, 1950, 71 y.o.   MRN: [id number]  Chief Complaint  Patient presents with   Atrial Fibrillation   Follow-up   HPI:    JASH WAHLEN  is a 71 y.o.  male patient with no significant prior cardiovascular history admitted on 09/08/2022 with cough and shortness of breath initially seen in an urgent care and in view of elevated heart rate was sent over to the emergency room, treated for possible pneumonia and also new onset atrial flutter with RVR/atrial fibrillation and new diagnosis of HFrEF.  He was seen by my partner and medication changes were done, sotalol discontinued and started on metoprolol.  However patient has not been feeling well and made another appointment to discuss medication changes and also states that since hospitalization and discharge, his activity level has markedly decreased and has to frequently stop even doing minimal activities with marked dyspnea and lack of energy and notices fatigue.  His wife who is present is very concerned, his daughter was on the phone during the office visit.  No PND or orthopnea, no cough.  He has not had any dizziness or syncope, states that he is tolerating the medications.  Past Medical History:  Diagnosis Date   Arrhythmia 09/08/2022   A. Fib with RVR   CHF (congestive heart failure) ([place]) 09/08/2022   LVEF 40-45%   Past Surgical History:  Procedure Laterality Date   MINOR CARPAL TUNNEL     Family History  Problem Relation Age of Onset   Atrial fibrillation Mother    Atrial fibrillation Father    Cancer - Prostate Father    Heart attack Maternal Grandfather     Social History   Tobacco Use   Smoking status: Never   Smokeless tobacco: Never  Substance Use Topics   Alcohol use: Not Currently   Marital Status: Married  ROS  Review of Systems  Constitutional: Positive for malaise/fatigue.  Cardiovascular:  Positive  for dyspnea on exertion. Negative for irregular heartbeat and palpitations.   Objective  Blood pressure 105/89, pulse (!) 51, resp. rate 16, height '5\' 7"'$  (1.702 m), weight 184 lb (83.5 kg), SpO2 97 %. Body mass index is 28.82 kg/m.     09/22/2022    1:55 PM 09/22/2022    1:54 PM 09/22/2022    1:53 PM  Vitals with BMI  Systolic 366 294 765  Diastolic 89 93 71  Pulse 51 109 101     Physical Exam Neck:     Vascular: No carotid bruit or JVD.  Cardiovascular:     Rate and Rhythm: Normal rate. Rhythm irregular.     Pulses: Normal pulses and intact distal pulses.     Heart sounds: No murmur heard. Pulmonary:     Effort: Pulmonary effort is normal.     Breath sounds: Normal breath sounds.  Abdominal:     General: Bowel sounds are normal.     Palpations: Abdomen is soft.  Musculoskeletal:     Right lower leg: No edema.     Left lower leg: No edema.  Skin:    Capillary Refill: Capillary refill takes less than 2 seconds.     Medications and allergies  No Known Allergies   Medication list after today's encounter   Current Outpatient Medications:    apixaban (ELIQUIS) 5 MG TABS tablet, Take 1 tablet (5 mg total) by mouth 2 (two) times daily., Disp:  60 tablet, Rfl: 1   Ascorbic Acid (VITAMIN C) 1000 MG tablet, Take 1,000 mg by mouth daily., Disp: , Rfl:    co-enzyme Q-10 30 MG capsule, Take 30 mg by mouth 3 (three) times daily., Disp: , Rfl:    KRILL OIL PO, Take 1 tablet by mouth daily., Disp: , Rfl:    sotalol (BETAPACE) 120 MG tablet, Take 1 tablet (120 mg total) by mouth 2 (two) times daily., Disp: 60 tablet, Rfl: 1   tamsulosin (FLOMAX) 0.4 MG CAPS capsule, Take 0.4 mg by mouth daily., Disp: , Rfl:    vitamin B-12 (CYANOCOBALAMIN) 50 MCG tablet, Take 50 mcg by mouth daily., Disp: , Rfl:    metoprolol succinate (TOPROL-XL) 100 MG 24 hr tablet, Take 0.5 tablets (50 mg total) by mouth in the morning and at bedtime. Take with or immediately following a meal., Disp: 180 tablet,  Rfl: 3  Laboratory examination:   Lab Results  Component Value Date   NA 137 09/08/2022   K 4.3 09/08/2022   CO2 27 09/08/2022   GLUCOSE 122 (H) 09/08/2022   BUN 12 09/08/2022   CREATININE 0.81 09/08/2022   CALCIUM 8.8 (L) 09/08/2022   GFRNONAA >60 09/08/2022       Latest Ref Rng & Units 09/08/2022    3:48 AM 09/07/2022    7:30 AM 09/06/2022    7:27 PM  CMP  Glucose 70 - 99 mg/dL 122  106  107   BUN 8 - 23 mg/dL '12  10  13   '$ Creatinine 0.61 - 1.24 mg/dL 0.81  0.87  0.95   Sodium 135 - 145 mmol/L 137  134  137   Potassium 3.5 - 5.1 mmol/L 4.3  3.8  4.0   Chloride 98 - 111 mmol/L 100  98  100   CO2 22 - 32 mmol/L '27  26  29   '$ Calcium 8.9 - 10.3 mg/dL 8.8  8.3  8.6   Total Protein 6.5 - 8.1 g/dL 6.1  6.3  6.8   Total Bilirubin 0.3 - 1.2 mg/dL 0.8  0.9  1.3   Alkaline Phos 38 - 126 U/L 99  108  116   AST 15 - 41 U/L 35  42  46   ALT 0 - 44 U/L 46  49  54       Latest Ref Rng & Units 09/08/2022    3:48 AM 09/07/2022    6:00 AM 09/06/2022    7:27 PM  CBC  WBC 4.0 - 10.5 K/uL 7.2  6.9  7.6   Hemoglobin 13.0 - 17.0 g/dL 12.0  10.9  12.1   Hematocrit 39.0 - 52.0 % 35.6  32.6  36.2   Platelets 150 - 400 K/uL 252  213  208    Lipid Panel Recent Labs    09/07/22 0730  CHOL 162  TRIG 35  LDLCALC 91  VLDL 7  HDL 64  CHOLHDL 2.5    HEMOGLOBIN A1C Lab Results  Component Value Date   HGBA1C 5.8 (H) 09/07/2022   MPG 120 09/07/2022   TSH Recent Labs    09/06/22 2200  TSH 0.795   Radiology:   [facility] Chest Port 1 View 09/06/2022 Mild cardiomegaly. Bronchitic changes which appear chronic. No acute airspace disease, pleural effusion or pneumothorax IMPRESSION: No active disease. Chronic bronchitic changes. Mild cardiomegaly.     CT angiogram chest 09/07/2022: 1. No evidence of pulmonary embolism.  2. A 2.1 cm x 1.6 cm x 1.6 cm  left lower lobe lung nodule versus focal airspace disease. While this may be infectious in etiology, the presence of an underlying neoplastic  process cannot be excluded. Consider one of the following in 3 months for both low-risk and high-risk individual 3. Moderate severity diffuse bilateral peribronchial thickening which may represent sequelae associated with acute or chronic bronchitis.  4. Mild coronary artery calcification.  5. Mild AP window and pretracheal lymphadenopathy which may be reactive in nature.  6. Aortic atherosclerosis.  Cardiac Studies:   Echocardiogram 09/07/2022:  1. Left ventricular ejection fraction, by estimation, is 40 to 45%. The left ventricle has mildly decreased function. The left ventricle demonstrates global hypokinesis. Left ventricular diastolic parameters are indeterminate.  2. Right ventricular systolic function is normal. The right ventricular size is normal. There is mildly elevated pulmonary artery systolic pressure.  Mild TR, PASP 43 mmHg.  3. Left atrial size was severely dilated.  4. Right atrial size was severely dilated.  5. No evidence of mitral valve regurgitation.  6. The aortic valve was not well visualized. Aortic valve regurgitation is not visualized.  7. The inferior vena cava is dilated in size with <50% respiratory variability, suggesting right atrial pressure of 15 mmHg.   Lower extremity venous duplex 09/07/2022: No evidence of DVT.  No cystic structures noted in the popliteal fossa.   EKG:   EKG 09/06/2022: Atypical atrial flutter/atrial fibrillation with rapid ventricular sponsor rate of 152 bpm, incomplete right bundle branch block.  Nonspecific T abnormality.  No prior EKG to compare.  EKG:   09/16/2022: Atrial fibrillation RVR, iRBBB -Nonspecific T-abnormality.  Assessment     ICD-10-CM   1. Persistent atrial fibrillation (HCC)  I48.19 sotalol (BETAPACE) 120 MG tablet    metoprolol succinate (TOPROL-XL) 100 MG 24 hr tablet    Ambulatory referral to Sleep Studies    DISCONTINUED: amiodarone (PACERONE) 200 MG tablet    2. Chronic systolic (congestive) heart  failure (HCC)  I50.22 PCV MYOCARDIAL PERFUSION WO LEXISCAN    Ambulatory referral to Sleep Studies    3. Essential hypertension  I10     4. Dyspnea on exertion  R06.09 PCV MYOCARDIAL PERFUSION WO LEXISCAN    5. Loud snoring  R06.83 Ambulatory referral to Sleep Studies       Orders Placed This Encounter  Procedures   Ambulatory referral to Sleep Studies    Referral Priority:   Routine    Referral Type:   Consultation    Referral Reason:   Specialty Services Required    Number of Visits Requested:   1   PCV MYOCARDIAL PERFUSION WO LEXISCAN    Standing Status:   Future    Standing Expiration Date:   11/23/2022    Meds ordered this encounter  Medications   DISCONTD: amiodarone (PACERONE) 200 MG tablet    Sig: Take 1 tablet (200 mg total) by mouth in the morning and at bedtime.    Dispense:  90 tablet    Refill:  0   sotalol (BETAPACE) 120 MG tablet    Sig: Take 1 tablet (120 mg total) by mouth 2 (two) times daily.    Dispense:  60 tablet    Refill:  1    Do not fill Amiodarone    Medications Discontinued During This Encounter  Medication Reason   amiodarone (PACERONE) 200 MG tablet Entry Error   metoprolol succinate (TOPROL-XL) 100 MG 24 hr tablet Reorder     Recommendations:   ICKER SWIGERT is a 71 y.o. male  patient with no significant prior cardiovascular history admitted on 09/08/2022 with cough and shortness of breath initially seen in an urgent care and in view of elevated heart rate was sent over to the emergency room, treated for possible pneumonia and also new onset atrial flutter with RVR/atrial fibrillation and new diagnosis of HFrEF with ejection fraction of 40 to 45% and elevated BNP.  1. Persistent atrial fibrillation ([place]) Patient is symptomatic persistent atrial fibrillation manifesting with shortness of breath and also decreased exercise tolerance with decreased LVEF.  He needs ischemic workup to exclude significant coronary disease and I started him back  on the sotalol which he was tolerating without any side effects and also prolonged QT during hospitalization.  He will start with 120 mg twice daily, will reduce the dose of the metoprolol succinate from 100 mg to 50 mg daily.  He may need 180 mg of sotalol and could discontinue metoprolol completely depending upon his response to direct-current cardioversion which will be performed once ischemia is excluded.  Schedule for Direct current cardioversion. I have discussed regarding risks benefits rate control vs rhythm control with the patient. Patient understands cardiac arrest and need for CPR, aspiration pneumonia, but not limited to these. Patient is willing.   He has coronary atherosclerosis by CT scan and aortic atherosclerosis as well.  He is overwhelmed by recent hospitalization, medication changes, will initiate at least a low-dose high intensity statins on his next office visit.  2. Chronic systolic (congestive) heart failure (HCC) I suppose his HFrEF is related to A-fib with RVR.  Hopefully by restoring sinus rhythm, there will be negative remodeling of both the atrial size and LV systolic function.  3. Essential hypertension Blood pressure was elevated during hospitalization and also during previous office visit, he is presently on metoprolol and blood pressure is well-controlled.  4. Dyspnea on exertion His anginal equivalent versus could be related to atrial fibrillation with reduced exercise capacity.  Will continue to follow this.  5. Loud snoring Patient has apneic episodes that is witnessed by his wife, loud snoring ongoing for several years.  Will refer him for sleep study.    [name], MD, [facility] 09/22/2022, 6:55 PM Office: [phone] Fax: [phone] Pager: [phone]

## 2022-09-23 ENCOUNTER — Ambulatory Visit: Payer: Medicare Other

## 2022-10-05 ENCOUNTER — Ambulatory Visit: Payer: Medicare Other

## 2022-10-05 DIAGNOSIS — R0609 Other forms of dyspnea: Secondary | ICD-10-CM | POA: Diagnosis not present

## 2022-10-05 DIAGNOSIS — I5022 Chronic systolic (congestive) heart failure: Secondary | ICD-10-CM | POA: Diagnosis not present

## 2022-10-06 NOTE — Progress Notes (Signed)
Findings suggestive of non ischemic cardiomyopathy from A. Fib

## 2022-10-06 NOTE — H&P (View-Only) (Signed)
Findings suggestive of non ischemic cardiomyopathy from A. Fib

## 2022-10-07 ENCOUNTER — Ambulatory Visit (HOSPITAL_COMMUNITY): Payer: Medicare Other | Admitting: Anesthesiology

## 2022-10-07 ENCOUNTER — Encounter (HOSPITAL_COMMUNITY)
Admission: RE | Disposition: A | Discharge status: Home / Self Care | Payer: Self-pay | Source: Home / Self Care | Attending: Cardiology

## 2022-10-07 ENCOUNTER — Ambulatory Visit (HOSPITAL_BASED_OUTPATIENT_CLINIC_OR_DEPARTMENT_OTHER): Payer: Medicare Other | Admitting: Anesthesiology

## 2022-10-07 ENCOUNTER — Encounter (HOSPITAL_COMMUNITY): Payer: Self-pay | Admitting: Cardiology

## 2022-10-07 ENCOUNTER — Ambulatory Visit (HOSPITAL_COMMUNITY)
Admission: RE | Admit: 2022-10-07 | Discharge: 2022-10-07 | Disposition: A | Discharge status: Home / Self Care | Payer: Medicare Other | Attending: Cardiology | Admitting: Cardiology

## 2022-10-07 ENCOUNTER — Other Ambulatory Visit: Payer: Self-pay

## 2022-10-07 DIAGNOSIS — I509 Heart failure, unspecified: Secondary | ICD-10-CM | POA: Diagnosis not present

## 2022-10-07 DIAGNOSIS — I4891 Unspecified atrial fibrillation: Secondary | ICD-10-CM | POA: Insufficient documentation

## 2022-10-07 DIAGNOSIS — I110 Hypertensive heart disease with heart failure: Secondary | ICD-10-CM | POA: Diagnosis not present

## 2022-10-07 DIAGNOSIS — I5020 Unspecified systolic (congestive) heart failure: Secondary | ICD-10-CM | POA: Diagnosis not present

## 2022-10-07 DIAGNOSIS — I4892 Unspecified atrial flutter: Secondary | ICD-10-CM | POA: Diagnosis not present

## 2022-10-07 HISTORY — PX: CARDIOVERSION: SHX1299

## 2022-10-07 LAB — POCT I-STAT, CHEM 8
BUN: 24 mg/dL — ABNORMAL HIGH (ref 8–23)
Calcium, Ion: 0.95 mmol/L — ABNORMAL LOW (ref 1.15–1.40)
Chloride: 107 mmol/L (ref 98–111)
Creatinine, Ser: 1.2 mg/dL (ref 0.61–1.24)
Glucose, Bld: 90 mg/dL (ref 70–99)
HCT: 41 % (ref 39.0–52.0)
Hemoglobin: 13.9 g/dL (ref 13.0–17.0)
Potassium: 4.3 mmol/L (ref 3.5–5.1)
Sodium: 137 mmol/L (ref 135–145)
TCO2: 24 mmol/L (ref 22–32)

## 2022-10-07 SURGERY — CARDIOVERSION
Anesthesia: General

## 2022-10-07 MED ORDER — PROPOFOL 10 MG/ML IV BOLUS
INTRAVENOUS | Status: DC | PRN
  Administered 2022-10-07: 50 mg via INTRAVENOUS

## 2022-10-07 MED ORDER — LIDOCAINE 2% (20 MG/ML) 5 ML SYRINGE
INTRAMUSCULAR | Status: DC | PRN
  Administered 2022-10-07: 60 mg via INTRAVENOUS

## 2022-10-07 MED ORDER — SODIUM CHLORIDE 0.9 % IV SOLN: INTRAVENOUS | Status: DC

## 2022-10-07 NOTE — Anesthesia Postprocedure Evaluation (Signed)
Anesthesia Post Note  Patient: Stephen Cortez  Procedure(s) Performed: CARDIOVERSION     Patient location during evaluation: PACU Anesthesia Type: General Level of consciousness: awake and alert Pain management: pain level controlled Vital Signs Assessment: post-procedure vital signs reviewed and stable Respiratory status: spontaneous breathing, nonlabored ventilation and respiratory function stable Cardiovascular status: stable, blood pressure returned to baseline and bradycardic Anesthetic complications: no   No notable events documented.  Last Vitals:  Vitals:   10/07/22 1131 10/07/22 1140  BP: 98/66 91/73  Pulse: (!) 53 (!) 49  Resp: (!) 25 14  Temp:    SpO2: 96% 97%    Last Pain:  Vitals:   10/07/22 0954  TempSrc: Temporal  PainSc: 0-No pain                 Audry Pili

## 2022-10-07 NOTE — Interval H&P Note (Signed)
History and Physical Interval Note:  10/07/2022 11:15 AM  Stephen Cortez  has presented today for surgery, with the diagnosis of ATRIAL FIBRILLATION.  The various methods of treatment have been discussed with the patient and family. After consideration of risks, benefits and other options for treatment, the patient has consented to  Procedure(s): CARDIOVERSION (N/A) as a surgical intervention.  The patient's history has been reviewed, patient examined, no change in status, stable for surgery.  I have reviewed the patient's chart and labs.  Questions were answered to the patient's satisfaction.     [name]

## 2022-10-07 NOTE — Discharge Instructions (Signed)
Electrical Cardioversion  Electrical cardioversion is the delivery of a jolt of electricity to restore a normal rhythm to the heart. A rhythm that is too fast or is not regular keeps the heart from pumping well. In this procedure, sticky patches or metal paddles are placed on the chest to deliver electricity to the heart from a device.  If this information does not answer your questions, please call Dr Einar Gip to clarify.   Follow these instructions at home: You may have some redness on the skin where the shocks were given.  You may apply over-the-counter hydrocortisone cream or aloe vera to alleviate skin irritation. YOU SHOULD NOT DRIVE, use power tools, machinery or perform tasks that involve climbing or major physical exertion for 24 hours (because of the sedation medicines used during the test).  Take over-the-counter and prescription medicines only as told by your health care provider. Ask your health care provider how to check your pulse. Check it often. Rest for 48 hours after the procedure or as told by your health care provider. Avoid or limit your caffeine use as told by your health care provider. Keep all follow-up visits as told by your health care provider. This is important.  FOLLOW UP:  Please also call with any specific questions about appointments or follow up tests.

## 2022-10-07 NOTE — Transfer of Care (Signed)
Immediate Anesthesia Transfer of Care Note  Patient: Stephen Cortez  Procedure(s) Performed: CARDIOVERSION  Patient Location: Endoscopy Unit  Anesthesia Type:General  Level of Consciousness: drowsy  Airway & Oxygen Therapy: Patient Spontanous Breathing  Post-op Assessment: Report given to RN and Post -op Vital signs reviewed and stable  Post vital signs: Reviewed and stable  Last Vitals:  Vitals Value Taken Time  BP 95/71 1126 10/07/22  Temp    Pulse 46 1126 10/07/22  Resp 16  1126 10/07/22  SpO2 98 1126 10/07/22    Last Pain:  Vitals:   10/07/22 0954  TempSrc: Temporal  PainSc: 0-No pain         Complications: No notable events documented.

## 2022-10-07 NOTE — Anesthesia Preprocedure Evaluation (Addendum)
Anesthesia Evaluation  Patient identified by MRN, date of birth, ID band Patient awake    Reviewed: Allergy & Precautions, NPO status , Patient's Chart, lab work & pertinent test results  History of Anesthesia Complications Negative for: history of anesthetic complications  Airway Mallampati: III  TM Distance: >3 FB Neck ROM: Full    Dental  (+) Dental Advisory Given   Pulmonary neg pulmonary ROS   Pulmonary exam normal        Cardiovascular hypertension, Pt. on home beta blockers and Pt. on medications +CHF  + dysrhythmias Atrial Fibrillation  Rhythm:Irregular Rate:Tachycardia   '23 Myoperfusion - Stress EKG is non-diagnostic due to baseline ST-T changes. Normal myocardial perfusion without convincing evidence of reversible myocardial ischemia or prior infarct. Left ventricular size mildly dilated, calculated LVEF 25% (visually moderately reduced, global hypokinesis - accuracy may be limited due to underlying rhythm being A-fib with RVR, recommend correlating LVEF with echocardiography).  Intermediate risk study -due to dilated LV cavity, reduced LVEF.  '23 TTE - EF 40 to 45%. The left ventricle has mildly decreased function. The left ventricle demonstrates global hypokinesis. There is mildly elevated pulmonary artery systolic pressure. Left atrial size was severely dilated. Right atrial size was severely dilated.     Neuro/Psych negative neurological ROS  negative psych ROS   GI/Hepatic negative GI ROS, Neg liver ROS,,,  Endo/Other  negative endocrine ROS    Renal/GU negative Renal ROS     Musculoskeletal negative musculoskeletal ROS (+)    Abdominal   Peds  Hematology  On eliquis    Anesthesia Other Findings   Reproductive/Obstetrics                             Anesthesia Physical Anesthesia Plan  ASA: 3  Anesthesia Plan: General   Post-op Pain Management: Minimal or no pain  anticipated   Induction: Intravenous  PONV Risk Score and Plan: 2 and Treatment may vary due to age or medical condition and Propofol infusion  Airway Management Planned: Natural Airway and Mask  Additional Equipment: None  Intra-op Plan:   Post-operative Plan:   Informed Consent: I have reviewed the patients History and Physical, chart, labs and discussed the procedure including the risks, benefits and alternatives for the proposed anesthesia with the patient or authorized representative who has indicated his/her understanding and acceptance.       Plan Discussed with: CRNA and Anesthesiologist  Anesthesia Plan Comments:        Anesthesia Quick Evaluation

## 2022-10-07 NOTE — Anesthesia Procedure Notes (Signed)
Procedure Name: General with mask airway Date/Time: 10/07/2022 11:21 AM  Performed by: Everlean Cherry A, CRNAPre-anesthesia Checklist: Patient identified, Emergency Drugs available, Suction available and Patient being monitored Patient Re-evaluated:Patient Re-evaluated prior to induction Oxygen Delivery Method: Ambu bag Preoxygenation: Pre-oxygenation with 100% oxygen Induction Type: IV induction Ventilation: Mask ventilation without difficulty Placement Confirmation: positive ETCO2 Dental Injury: Teeth and Oropharynx as per pre-operative assessment

## 2022-10-09 ENCOUNTER — Encounter (HOSPITAL_COMMUNITY): Payer: Self-pay | Admitting: Cardiology

## 2022-10-12 NOTE — H&P (Signed)
Primary Physician/Referring:  Horald Pollen, MD  Patient ID: Stephen Cortez, male    DOB: [birth year], 72 y.o.   MRN: [id number]  Chief Complaint  Patient presents with   Atrial Fibrillation   Follow-up   HPI:    Stephen Cortez  is a 72 y.o.  male patient with no significant prior cardiovascular history admitted on 09/08/2022 with cough and shortness of breath initially seen in an urgent care and in view of elevated heart rate was sent over to the emergency room, treated for possible pneumonia and also new onset atrial flutter with RVR/atrial fibrillation and new diagnosis of HFrEF.  He was seen by my partner and medication changes were done, sotalol discontinued and started on metoprolol.  However patient has not been feeling well and made another appointment to discuss medication changes and also states that since hospitalization and discharge, his activity level has markedly decreased and has to frequently stop even doing minimal activities with marked dyspnea and lack of energy and notices fatigue.  His wife who is present is very concerned, his daughter was on the phone during the office visit.  No PND or orthopnea, no cough.  He has not had any dizziness or syncope, states that he is tolerating the medications.  Past Medical History:  Diagnosis Date   Arrhythmia 09/08/2022   A. Fib with RVR   CHF (congestive heart failure) ([place]) 09/08/2022   LVEF 40-45%   Past Surgical History:  Procedure Laterality Date   MINOR CARPAL TUNNEL     Family History  Problem Relation Age of Onset   Atrial fibrillation Mother    Atrial fibrillation Father    Cancer - Prostate Father    Heart attack Maternal Grandfather     Social History   Tobacco Use   Smoking status: Never   Smokeless tobacco: Never  Substance Use Topics   Alcohol use: Not Currently   Marital Status: Married  ROS  Review of Systems  Constitutional: Positive for malaise/fatigue.  Cardiovascular:  Positive  for dyspnea on exertion. Negative for irregular heartbeat and palpitations.   Objective  Blood pressure 105/89, pulse (!) 51, resp. rate 16, height '5\' 7"'$  (1.702 m), weight 184 lb (83.5 kg), SpO2 97 %. Body mass index is 28.82 kg/m.     09/22/2022    1:55 PM 09/22/2022    1:54 PM 09/22/2022    1:53 PM  Vitals with BMI  Systolic 737 106 269  Diastolic 89 93 71  Pulse 51 109 101     Physical Exam Neck:     Vascular: No carotid bruit or JVD.  Cardiovascular:     Rate and Rhythm: Normal rate. Rhythm irregular.     Pulses: Normal pulses and intact distal pulses.     Heart sounds: No murmur heard. Pulmonary:     Effort: Pulmonary effort is normal.     Breath sounds: Normal breath sounds.  Abdominal:     General: Bowel sounds are normal.     Palpations: Abdomen is soft.  Musculoskeletal:     Right lower leg: No edema.     Left lower leg: No edema.  Skin:    Capillary Refill: Capillary refill takes less than 2 seconds.     Medications and allergies  No Known Allergies   Medication list after today's encounter   Current Outpatient Medications:    apixaban (ELIQUIS) 5 MG TABS tablet, Take 1 tablet (5 mg total) by mouth 2 (two) times daily., Disp:  60 tablet, Rfl: 1   Ascorbic Acid (VITAMIN C) 1000 MG tablet, Take 1,000 mg by mouth daily., Disp: , Rfl:    co-enzyme Q-10 30 MG capsule, Take 30 mg by mouth 3 (three) times daily., Disp: , Rfl:    KRILL OIL PO, Take 1 tablet by mouth daily., Disp: , Rfl:    sotalol (BETAPACE) 120 MG tablet, Take 1 tablet (120 mg total) by mouth 2 (two) times daily., Disp: 60 tablet, Rfl: 1   tamsulosin (FLOMAX) 0.4 MG CAPS capsule, Take 0.4 mg by mouth daily., Disp: , Rfl:    vitamin B-12 (CYANOCOBALAMIN) 50 MCG tablet, Take 50 mcg by mouth daily., Disp: , Rfl:    metoprolol succinate (TOPROL-XL) 100 MG 24 hr tablet, Take 0.5 tablets (50 mg total) by mouth in the morning and at bedtime. Take with or immediately following a meal., Disp: 180 tablet,  Rfl: 3  Laboratory examination:   Lab Results  Component Value Date   NA 137 09/08/2022   K 4.3 09/08/2022   CO2 27 09/08/2022   GLUCOSE 122 (H) 09/08/2022   BUN 12 09/08/2022   CREATININE 0.81 09/08/2022   CALCIUM 8.8 (L) 09/08/2022   GFRNONAA >60 09/08/2022       Latest Ref Rng & Units 09/08/2022    3:48 AM 09/07/2022    7:30 AM 09/06/2022    7:27 PM  CMP  Glucose 70 - 99 mg/dL 122  106  107   BUN 8 - 23 mg/dL '12  10  13   '$ Creatinine 0.61 - 1.24 mg/dL 0.81  0.87  0.95   Sodium 135 - 145 mmol/L 137  134  137   Potassium 3.5 - 5.1 mmol/L 4.3  3.8  4.0   Chloride 98 - 111 mmol/L 100  98  100   CO2 22 - 32 mmol/L '27  26  29   '$ Calcium 8.9 - 10.3 mg/dL 8.8  8.3  8.6   Total Protein 6.5 - 8.1 g/dL 6.1  6.3  6.8   Total Bilirubin 0.3 - 1.2 mg/dL 0.8  0.9  1.3   Alkaline Phos 38 - 126 U/L 99  108  116   AST 15 - 41 U/L 35  42  46   ALT 0 - 44 U/L 46  49  54       Latest Ref Rng & Units 09/08/2022    3:48 AM 09/07/2022    6:00 AM 09/06/2022    7:27 PM  CBC  WBC 4.0 - 10.5 K/uL 7.2  6.9  7.6   Hemoglobin 13.0 - 17.0 g/dL 12.0  10.9  12.1   Hematocrit 39.0 - 52.0 % 35.6  32.6  36.2   Platelets 150 - 400 K/uL 252  213  208    Lipid Panel Recent Labs    09/07/22 0730  CHOL 162  TRIG 35  LDLCALC 91  VLDL 7  HDL 64  CHOLHDL 2.5    HEMOGLOBIN A1C Lab Results  Component Value Date   HGBA1C 5.8 (H) 09/07/2022   MPG 120 09/07/2022   TSH Recent Labs    09/06/22 2200  TSH 0.795   Radiology:   [facility] Chest Port 1 View 09/06/2022 Mild cardiomegaly. Bronchitic changes which appear chronic. No acute airspace disease, pleural effusion or pneumothorax IMPRESSION: No active disease. Chronic bronchitic changes. Mild cardiomegaly.     CT angiogram chest 09/07/2022: 1. No evidence of pulmonary embolism.  2. A 2.1 cm x 1.6 cm x 1.6 cm  left lower lobe lung nodule versus focal airspace disease. While this may be infectious in etiology, the presence of an underlying neoplastic  process cannot be excluded. Consider one of the following in 3 months for both low-risk and high-risk individual 3. Moderate severity diffuse bilateral peribronchial thickening which may represent sequelae associated with acute or chronic bronchitis.  4. Mild coronary artery calcification.  5. Mild AP window and pretracheal lymphadenopathy which may be reactive in nature.  6. Aortic atherosclerosis.  Cardiac Studies:   Echocardiogram 09/07/2022:  1. Left ventricular ejection fraction, by estimation, is 40 to 45%. The left ventricle has mildly decreased function. The left ventricle demonstrates global hypokinesis. Left ventricular diastolic parameters are indeterminate.  2. Right ventricular systolic function is normal. The right ventricular size is normal. There is mildly elevated pulmonary artery systolic pressure.  Mild TR, PASP 43 mmHg.  3. Left atrial size was severely dilated.  4. Right atrial size was severely dilated.  5. No evidence of mitral valve regurgitation.  6. The aortic valve was not well visualized. Aortic valve regurgitation is not visualized.  7. The inferior vena cava is dilated in size with <50% respiratory variability, suggesting right atrial pressure of 15 mmHg.   Lower extremity venous duplex 09/07/2022: No evidence of DVT.  No cystic structures noted in the popliteal fossa.   EKG:   EKG 09/06/2022: Atypical atrial flutter/atrial fibrillation with rapid ventricular sponsor rate of 152 bpm, incomplete right bundle branch block.  Nonspecific T abnormality.  No prior EKG to compare.  EKG:   09/16/2022: Atrial fibrillation RVR, iRBBB -Nonspecific T-abnormality.  Assessment     ICD-10-CM   1. Persistent atrial fibrillation (HCC)  I48.19 sotalol (BETAPACE) 120 MG tablet    metoprolol succinate (TOPROL-XL) 100 MG 24 hr tablet    Ambulatory referral to Sleep Studies    DISCONTINUED: amiodarone (PACERONE) 200 MG tablet    2. Chronic systolic (congestive) heart  failure (HCC)  I50.22 PCV MYOCARDIAL PERFUSION WO LEXISCAN    Ambulatory referral to Sleep Studies    3. Essential hypertension  I10     4. Dyspnea on exertion  R06.09 PCV MYOCARDIAL PERFUSION WO LEXISCAN    5. Loud snoring  R06.83 Ambulatory referral to Sleep Studies       Orders Placed This Encounter  Procedures   Ambulatory referral to Sleep Studies    Referral Priority:   Routine    Referral Type:   Consultation    Referral Reason:   Specialty Services Required    Number of Visits Requested:   1   PCV MYOCARDIAL PERFUSION WO LEXISCAN    Standing Status:   Future    Standing Expiration Date:   11/23/2022    Meds ordered this encounter  Medications   DISCONTD: amiodarone (PACERONE) 200 MG tablet    Sig: Take 1 tablet (200 mg total) by mouth in the morning and at bedtime.    Dispense:  90 tablet    Refill:  0   sotalol (BETAPACE) 120 MG tablet    Sig: Take 1 tablet (120 mg total) by mouth 2 (two) times daily.    Dispense:  60 tablet    Refill:  1    Do not fill Amiodarone    Medications Discontinued During This Encounter  Medication Reason   amiodarone (PACERONE) 200 MG tablet Entry Error   metoprolol succinate (TOPROL-XL) 100 MG 24 hr tablet Reorder     Recommendations:   BURLON CENTRELLA is a 72 y.o. male  patient with no significant prior cardiovascular history admitted on 09/08/2022 with cough and shortness of breath initially seen in an urgent care and in view of elevated heart rate was sent over to the emergency room, treated for possible pneumonia and also new onset atrial flutter with RVR/atrial fibrillation and new diagnosis of HFrEF with ejection fraction of 40 to 45% and elevated BNP.  1. Persistent atrial fibrillation ([place]) Patient is symptomatic persistent atrial fibrillation manifesting with shortness of breath and also decreased exercise tolerance with decreased LVEF.  He needs ischemic workup to exclude significant coronary disease and I started him back  on the sotalol which he was tolerating without any side effects and also prolonged QT during hospitalization.  He will start with 120 mg twice daily, will reduce the dose of the metoprolol succinate from 100 mg to 50 mg daily.  He may need 180 mg of sotalol and could discontinue metoprolol completely depending upon his response to direct-current cardioversion which will be performed once ischemia is excluded.  Schedule for Direct current cardioversion. I have discussed regarding risks benefits rate control vs rhythm control with the patient. Patient understands cardiac arrest and need for CPR, aspiration pneumonia, but not limited to these. Patient is willing.   He has coronary atherosclerosis by CT scan and aortic atherosclerosis as well.  He is overwhelmed by recent hospitalization, medication changes, will initiate at least a low-dose high intensity statins on his next office visit.  2. Chronic systolic (congestive) heart failure (HCC) I suppose his HFrEF is related to A-fib with RVR.  Hopefully by restoring sinus rhythm, there will be negative remodeling of both the atrial size and LV systolic function.  3. Essential hypertension Blood pressure was elevated during hospitalization and also during previous office visit, he is presently on metoprolol and blood pressure is well-controlled.  4. Dyspnea on exertion His anginal equivalent versus could be related to atrial fibrillation with reduced exercise capacity.  Will continue to follow this.  5. Loud snoring Patient has apneic episodes that is witnessed by his wife, loud snoring ongoing for several years.  Will refer him for sleep study.    [name], MD, [facility] 09/22/2022, 6:55 PM Office: [phone] Fax: [phone] Pager: [phone]

## 2022-10-19 ENCOUNTER — Ambulatory Visit: Payer: Medicare Other | Admitting: Cardiology

## 2022-10-20 ENCOUNTER — Ambulatory Visit: Payer: Medicare Other | Admitting: Cardiology

## 2022-10-20 ENCOUNTER — Encounter: Payer: Self-pay | Admitting: Cardiology

## 2022-10-20 VITALS — BP 143/88 | HR 49 | Ht 67.0 in | Wt 192.2 lb

## 2022-10-20 DIAGNOSIS — I10 Essential (primary) hypertension: Secondary | ICD-10-CM

## 2022-10-20 DIAGNOSIS — I5043 Acute on chronic combined systolic (congestive) and diastolic (congestive) heart failure: Secondary | ICD-10-CM

## 2022-10-20 DIAGNOSIS — I480 Paroxysmal atrial fibrillation: Secondary | ICD-10-CM

## 2022-10-20 MED ORDER — VALSARTAN-HYDROCHLOROTHIAZIDE 160-25 MG PO TABS: 1.0000 | ORAL_TABLET | ORAL | 2 refills | Status: DC

## 2022-10-20 NOTE — Progress Notes (Signed)
Primary Physician/Referring:  Horald Pollen, MD  Patient ID: Stephen Cortez, male    DOB: 1951/01/09, 72 y.o.   MRN: [id number]  Chief Complaint  Patient presents with   Atrial Fibrillation   Chronic systolic (congestive) heart failure   Follow-up    Cardioversion  Retaining fluid Hr in the 40's and 50's   HPI:    Stephen Cortez  is a 72 y.o.  male patient with no significant prior cardiovascular history admitted on 09/08/2022 with  pneumonia and also new onset atrial flutter with RVR/atrial fibrillation and new diagnosis of HFrEF.  On his last office visit on 09/22/2022, I reinitiated sotalol which she was tolerating and he was scheduled for direct-current cardioversion on 02/05/2022 and he now presents for follow-up.   He still continues to endorse fatigue, dyspnea on exertion.  Patient's wife states that he has gained about 3 to 4 pounds in weight as well and has noticed mild leg edema.  No PND or orthopnea, no cough.  He has not had any dizziness or syncope, states that he is tolerating the medications.  Past Medical History:  Diagnosis Date   Arrhythmia 09/08/2022   A. Fib with RVR   CHF (congestive heart failure) ([place]) 09/08/2022   LVEF 40-45%   Past Surgical History:  Procedure Laterality Date   CARDIOVERSION N/A 10/07/2022   Procedure: CARDIOVERSION;  Surgeon: [name], MD;  Location: [facility] ENDOSCOPY;  Service: Cardiovascular;  Laterality: N/A;   MINOR CARPAL TUNNEL     Family History  Problem Relation Age of Onset   Atrial fibrillation Mother    Atrial fibrillation Father    Cancer - Prostate Father    Heart attack Maternal Grandfather     Social History   Tobacco Use   Smoking status: Never   Smokeless tobacco: Never  Substance Use Topics   Alcohol use: Not Currently   Marital Status: Married  ROS  Review of Systems  Constitutional: Positive for malaise/fatigue.  Cardiovascular:  Positive for dyspnea on exertion and leg swelling. Negative for  irregular heartbeat and palpitations.   Objective  Blood pressure (!) 143/88, pulse (!) 49, height '5\' 7"'$  (1.702 m), weight 192 lb 3.2 oz (87.2 kg), SpO2 97 %. Body mass index is 30.1 kg/m.     10/20/2022    1:41 PM 10/07/2022   11:40 AM 10/07/2022   11:31 AM  Vitals with BMI  Height '5\' 7"'$     Weight 192 lbs 3 oz    BMI 41.6    Systolic 606 91 98  Diastolic 88 73 66  Pulse 49 49 53     Physical Exam Neck:     Vascular: JVD present. No carotid bruit.  Cardiovascular:     Rate and Rhythm: Normal rate. Rhythm irregular.     Pulses: Normal pulses and intact distal pulses.     Heart sounds: No murmur heard. Pulmonary:     Effort: Pulmonary effort is normal.     Breath sounds: Normal breath sounds.  Abdominal:     General: Bowel sounds are normal.     Palpations: Abdomen is soft.  Musculoskeletal:     Right lower leg: Edema (1-2+ pitting) present.     Left lower leg: Edema (1-2+ pitting) present.  Skin:    Capillary Refill: Capillary refill takes less than 2 seconds.     Medications and allergies  No Known Allergies   Medication list after today's encounter   Current Outpatient Medications:  Acetylcarnitine HCl (ACETYL L-CARNITINE PO), Take 400 mg by mouth daily., Disp: , Rfl:    Alpha Lipoic Acid 200 MG CAPS, Take 200 mg by mouth daily., Disp: , Rfl:    apixaban (ELIQUIS) 5 MG TABS tablet, Take 1 tablet (5 mg total) by mouth 2 (two) times daily., Disp: 60 tablet, Rfl: 1   Ascorbic Acid (VITAMIN C) 1000 MG tablet, Take 500 mg by mouth daily., Disp: , Rfl:    B Complex-C (SUPER B COMPLEX PO), Take 1 tablet by mouth daily., Disp: , Rfl:    Biotin 5000 MCG CAPS, Take 5,000 mg by mouth daily., Disp: , Rfl:    Calcium-Magnesium-Zinc (CAL-MAG-ZINC PO), Take 1 tablet by mouth daily., Disp: , Rfl:    Cholecalciferol (VITAMIN D3) 50 MCG (2000 UT) capsule, Take 2,000 Units by mouth daily., Disp: , Rfl:    co-enzyme Q-10 30 MG capsule, Take 30 mg by mouth daily., Disp: , Rfl:     Krill Oil 500 MG CAPS, Take 500 mg by mouth daily., Disp: , Rfl:    Omega-3 Fatty Acids (FISH OIL PO), Take 1,200 mg by mouth daily., Disp: , Rfl:    sotalol (BETAPACE) 120 MG tablet, Take 1 tablet (120 mg total) by mouth 2 (two) times daily., Disp: 60 tablet, Rfl: 1   tamsulosin (FLOMAX) 0.4 MG CAPS capsule, Take 0.4 mg by mouth daily., Disp: , Rfl:    valsartan-hydrochlorothiazide (DIOVAN HCT) 160-25 MG tablet, Take 1 tablet by mouth every morning., Disp: 30 tablet, Rfl: 2   vitamin E 180 MG (400 UNITS) capsule, Take 800 Units by mouth daily., Disp: , Rfl:   Laboratory examination:   Lab Results  Component Value Date   NA 137 10/07/2022   K 4.3 10/07/2022   CO2 27 09/08/2022   GLUCOSE 90 10/07/2022   BUN 24 (H) 10/07/2022   CREATININE 1.20 10/07/2022   CALCIUM 8.8 (L) 09/08/2022   GFRNONAA >60 09/08/2022       Latest Ref Rng & Units 10/07/2022   10:28 AM 09/08/2022    3:48 AM 09/07/2022    7:30 AM  CMP  Glucose 70 - 99 mg/dL 90  122  106   BUN 8 - 23 mg/dL '24  12  10   '$ Creatinine 0.61 - 1.24 mg/dL 1.20  0.81  0.87   Sodium 135 - 145 mmol/L 137  137  134   Potassium 3.5 - 5.1 mmol/L 4.3  4.3  3.8   Chloride 98 - 111 mmol/L 107  100  98   CO2 22 - 32 mmol/L  27  26   Calcium 8.9 - 10.3 mg/dL  8.8  8.3   Total Protein 6.5 - 8.1 g/dL  6.1  6.3   Total Bilirubin 0.3 - 1.2 mg/dL  0.8  0.9   Alkaline Phos 38 - 126 U/L  99  108   AST 15 - 41 U/L  35  42   ALT 0 - 44 U/L  46  49       Latest Ref Rng & Units 10/07/2022   10:28 AM 09/08/2022    3:48 AM 09/07/2022    6:00 AM  CBC  WBC 4.0 - 10.5 K/uL  7.2  6.9   Hemoglobin 13.0 - 17.0 g/dL 13.9  12.0  10.9   Hematocrit 39.0 - 52.0 % 41.0  35.6  32.6   Platelets 150 - 400 K/uL  252  213    Lipid Panel Recent Labs    09/07/22  0730  CHOL 162  TRIG 35  LDLCALC 91  VLDL 7  HDL 64  CHOLHDL 2.5    HEMOGLOBIN A1C Lab Results  Component Value Date   HGBA1C 5.8 (H) 09/07/2022   MPG 120 09/07/2022   TSH Recent Labs     09/06/22 2200  TSH 0.795   BNP (last 3 results) Recent Labs    09/06/22 2000 09/08/22 0348  BNP 274.5* 220.5*   Radiology:   [facility] Chest Port 1 View 09/06/2022 Mild cardiomegaly. Bronchitic changes which appear chronic. No acute airspace disease, pleural effusion or pneumothorax IMPRESSION: No active disease. Chronic bronchitic changes. Mild cardiomegaly.     CT angiogram chest 09/07/2022: 1. No evidence of pulmonary embolism.  2. A 2.1 cm x 1.6 cm x 1.6 cm left lower lobe lung nodule versus focal airspace disease. While this may be infectious in etiology, the presence of an underlying neoplastic process cannot be excluded. Consider one of the following in 3 months for both low-risk and high-risk individual 3. Moderate severity diffuse bilateral peribronchial thickening which may represent sequelae associated with acute or chronic bronchitis.  4. Mild coronary artery calcification.  5. Mild AP window and pretracheal lymphadenopathy which may be reactive in nature.  6. Aortic atherosclerosis.  Cardiac Studies:   Echocardiogram 09/07/2022:  1. Left ventricular ejection fraction, by estimation, is 40 to 45%. The left ventricle has mildly decreased function. The left ventricle demonstrates global hypokinesis. Left ventricular diastolic parameters are indeterminate.  2. Right ventricular systolic function is normal. The right ventricular size is normal. There is mildly elevated pulmonary artery systolic pressure.  Mild TR, PASP 43 mmHg.  3. Left atrial size was severely dilated.  4. Right atrial size was severely dilated.  5. No evidence of mitral valve regurgitation.  6. The aortic valve was not well visualized. Aortic valve regurgitation is not visualized.  7. The inferior vena cava is dilated in size with <50% respiratory variability, suggesting right atrial pressure of 15 mmHg.   Lower extremity venous duplex 09/07/2022: No evidence of DVT.  No cystic structures noted in the  popliteal fossa.  PCV MYOCARDIAL PERFUSION WO LEXISCAN 10/05/2022  Narrative Lexiscan Tetrofosmin stress test 10/05/2022: 1 Day Rest/Stress protocol. Stress EKG is non-diagnostic due to baseline ST-T changes. Normal myocardial perfusion without convincing evidence of reversible myocardial ischemia or prior infarct. Left ventricular size mildly dilated, calculated LVEF 25% (visually moderately reduced, global hypokinesis - accuracy may be limited due to underlying rhythm being A-fib with RVR, recommend correlating LVEF with echocardiography). Intermediate risk study -due to dilated LV cavity, reduced LVEF. Clinical correlation required. No prior studies available for comparison.    EKG:   EKG 10/20/2022: Marked sinus bradycardia at the rate of 49 bpm, normal axis, incomplete right bundle branch block.  Nonspecific ST-T abnormality.  Normal QT interval, QTc 395 ms.  Compared to 09/16/2022, atrial fibrillation no longer present.  EKG 09/07/2022: Marked sinus bradycardia at the rate of 44 bpm, normal axis, nonspecific T abnormality.  Normal QT interval, QTc 360.    EKG 09/06/2022: Atypical atrial flutter/atrial fibrillation with rapid ventricular sponsor rate of 152 bpm, incomplete right bundle branch block.  Nonspecific T abnormality.  No prior EKG to compare.  EKG:   09/16/2022: Atrial fibrillation RVR, iRBBB -Nonspecific T-abnormality.  Assessment     ICD-10-CM   1. Acute on chronic combined systolic and diastolic CHF (congestive heart failure) (HCC)  I50.43 Pro b natriuretic peptide (BNP)    valsartan-hydrochlorothiazide (DIOVAN HCT) 160-25 MG tablet  Basic metabolic panel    2. Paroxysmal atrial fibrillation (HCC)  I48.0 EKG 12-Lead    3. Essential hypertension  I10       CHA2DS2-VASc Score is 3.  Yearly risk of stroke: 3.2% (A, HTN, CHF).  Score of 1=0.6; 2=2.2; 3=3.2; 4=4.8; 5=7.2; 6=9.8; 7=>9.8) -(CHF; HTN; vasc disease DM,  Male = 1; Age <65 =0; 65-74 = 1,  >75 =2;  stroke/embolism= 2).    Orders Placed This Encounter  Procedures   Pro b natriuretic peptide (BNP)   Basic metabolic panel   EKG [id number]    Meds ordered this encounter  Medications   valsartan-hydrochlorothiazide (DIOVAN HCT) 160-25 MG tablet    Sig: Take 1 tablet by mouth every morning.    Dispense:  30 tablet    Refill:  2    Medications Discontinued During This Encounter  Medication Reason   metoprolol succinate (TOPROL-XL) 100 MG 24 hr tablet Discontinued by provider   metoprolol succinate (TOPROL-XL) 100 MG 24 hr tablet Discontinued by provider   Recommendations:   Stephen Cortez is a 72 y.o. male patient with no significant prior cardiovascular history admitted on 09/08/2022 with  pneumonia and also new onset atrial flutter with RVR/atrial fibrillation and new diagnosis of HFrEF, EF 40 to 45%.  On his last office visit on 09/22/2022, I reinitiated sotalol which she was tolerating and he he was scheduled for direct-current cardioversion on 02/05/2022 but found to be in sinus rhythm and he now presents for follow-up.   1. Paroxysmal atrial fibrillation [facility] Patient is presently maintaining sinus rhythm however he is markedly bradycardic.  I will discontinue metoprolol succinate 50 mg twice daily.  Continue sotalol 120 mg daily.  QT interval is normal.  2.  Acute on chronic systolic (congestive) heart failure (HCC) Patient has gained about 3 to 5 pounds in weight and is also noticed mild leg edema and has dyspnea and fatigue.  We will start him on valsartan HCT 160 mg / 25 mg in the morning, will obtain BNP and BMP in 10 days to 12 days, I would like to see him back in about 2 to 3 weeks for follow-up of heart failure.  Now that he is maintaining sinus rhythm, I expect LVEF to improve.  I would also consider starting him on Entresto if he does not respond to simple medical regimen.  Suspect his cardiomyopathy is related to new onset atrial fibrillation with rapid ventricular  response.  3. Essential hypertension Blood pressure is elevated today however I am starting him on valsartan HCT hopefully this will control the blood pressure well.  His kidney function will be closely followed.  4. Loud snoring Patient has apneic episodes that is witnessed by his wife, loud snoring ongoing for several years.  Sleep referral has been made, he will make an appointment to see them.   [name], MD, [facility] 10/20/2022, 2:09 PM Office: [phone] Fax: [phone] Pager: [phone]

## 2022-11-01 LAB — BASIC METABOLIC PANEL
BUN/Creatinine Ratio: 15 (ref 10–24)
BUN: 18 mg/dL (ref 8–27)
CO2: 23 mmol/L (ref 20–29)
Calcium: 9.2 mg/dL (ref 8.6–10.2)
Chloride: 94 mmol/L — ABNORMAL LOW (ref 96–106)
Creatinine, Ser: 1.24 mg/dL (ref 0.76–1.27)
Glucose: 115 mg/dL — ABNORMAL HIGH (ref 70–99)
Potassium: 4.9 mmol/L (ref 3.5–5.2)
Sodium: 135 mmol/L (ref 134–144)
eGFR: 62 mL/min/{1.73_m2} (ref >59)

## 2022-11-01 LAB — PRO B NATRIURETIC PEPTIDE

## 2022-11-03 ENCOUNTER — Other Ambulatory Visit: Payer: Self-pay

## 2022-11-03 MED ORDER — APIXABAN 5 MG PO TABS: 5.0000 mg | ORAL_TABLET | Freq: Two times a day (BID) | ORAL | 1 refills | Status: DC

## 2022-11-09 ENCOUNTER — Other Ambulatory Visit: Payer: Self-pay

## 2022-11-09 MED ORDER — APIXABAN 5 MG PO TABS: 5.0000 mg | ORAL_TABLET | Freq: Two times a day (BID) | ORAL | 1 refills | Status: DC

## 2022-11-10 ENCOUNTER — Encounter: Payer: Self-pay | Admitting: Cardiology

## 2022-11-10 ENCOUNTER — Ambulatory Visit: Payer: BLUE CROSS/BLUE SHIELD | Admitting: Cardiology

## 2022-11-10 VITALS — BP 121/73 | HR 59 | Ht 67.0 in | Wt 177.0 lb

## 2022-11-10 DIAGNOSIS — R0683 Snoring: Secondary | ICD-10-CM | POA: Diagnosis not present

## 2022-11-10 DIAGNOSIS — I5022 Chronic systolic (congestive) heart failure: Secondary | ICD-10-CM

## 2022-11-10 DIAGNOSIS — I10 Essential (primary) hypertension: Secondary | ICD-10-CM | POA: Diagnosis not present

## 2022-11-10 DIAGNOSIS — I480 Paroxysmal atrial fibrillation: Secondary | ICD-10-CM | POA: Diagnosis not present

## 2022-11-10 MED ORDER — SOTALOL HCL 120 MG PO TABS: 120.0000 mg | ORAL_TABLET | Freq: Two times a day (BID) | ORAL | 3 refills | Status: DC

## 2022-11-10 MED ORDER — VALSARTAN-HYDROCHLOROTHIAZIDE 160-25 MG PO TABS: 1.0000 | ORAL_TABLET | ORAL | 3 refills | Status: DC

## 2022-11-10 MED ORDER — APIXABAN 5 MG PO TABS: 5.0000 mg | ORAL_TABLET | Freq: Two times a day (BID) | ORAL | 3 refills | Status: DC

## 2022-11-10 NOTE — Progress Notes (Signed)
Primary Physician/Referring:  Horald Pollen, MD  Patient ID: Stephen Cortez, male    DOB: November 27, 1950, 72 y.o.   MRN: [id number]  Chief Complaint  Patient presents with   Paroxysmal atrial fibrillation   HPI:    KAIREE ISA  is a 72 y.o.  male patient with no significant prior cardiovascular history admitted on 09/08/2022 with  pneumonia and also new onset atrial flutter with RVR/atrial fibrillation and new diagnosis of HFrEF, EF 40 to 45%.  On his last office visit on 09/22/2022, I reinitiated sotalol which he was tolerating and he he was scheduled for direct-current cardioversion on 10/07/2022 but found to be in sinus rhythm.  He points for follow-up of hypertension, acute on chronic systolic heart failure and also PAF, I had seen him 4 weeks ago.    On his last office visit due to marked fatigue, shortness of breath, bradycardia, I had discontinued metoprolol and continued sotalol.  He now presents for follow-up, states that his dyspnea has improved, fatigue has resolved, he feels like he is back to his baseline at at least 95% or so.  He is very pleased with this.  No further leg edema, no PND or orthopnea.  Tolerating all his medications well.  Past Medical History:  Diagnosis Date   Arrhythmia 09/08/2022   A. Fib with RVR   CHF (congestive heart failure) ([place]) 09/08/2022   LVEF 40-45%   Past Surgical History:  Procedure Laterality Date   CARDIOVERSION N/A 10/07/2022   Procedure: CARDIOVERSION;  Surgeon: [name], MD;  Location: [facility] ENDOSCOPY;  Service: Cardiovascular;  Laterality: N/A;   MINOR CARPAL TUNNEL     Family History  Problem Relation Age of Onset   Atrial fibrillation Mother    Atrial fibrillation Father    Cancer - Prostate Father    Heart attack Maternal Grandfather     Social History   Tobacco Use   Smoking status: Never   Smokeless tobacco: Never  Substance Use Topics   Alcohol use: Not Currently   Marital Status: Married  ROS  Review  of Systems  Cardiovascular:  Negative for chest pain, dyspnea on exertion and leg swelling.   Objective  Blood pressure 121/73, pulse (!) 59, height '5\' 7"'$  (1.702 m), weight 177 lb (80.3 kg), SpO2 97 %. Body mass index is 27.72 kg/m.     11/10/2022    3:51 PM 10/20/2022    1:41 PM 10/07/2022   11:40 AM  Vitals with BMI  Height '5\' 7"'$  '5\' 7"'$    Weight 177 lbs 192 lbs 3 oz   BMI 16.96 78.9   Systolic 381 017 91  Diastolic 73 88 73  Pulse 59 49 49     Physical Exam Neck:     Vascular: JVD present. No carotid bruit.  Cardiovascular:     Rate and Rhythm: Normal rate and regular rhythm.     Pulses: Normal pulses and intact distal pulses.     Heart sounds: No murmur heard. Pulmonary:     Effort: Pulmonary effort is normal.     Breath sounds: Normal breath sounds.  Abdominal:     General: Bowel sounds are normal.     Palpations: Abdomen is soft.  Musculoskeletal:     Right lower leg: Edema (Trace) present.     Left lower leg: Edema (Trace) present.  Skin:    Capillary Refill: Capillary refill takes less than 2 seconds.     Medications and allergies  No Known Allergies  Medication list after today's encounter   Current Outpatient Medications:    Acetylcarnitine HCl (ACETYL L-CARNITINE PO), Take 400 mg by mouth daily., Disp: , Rfl:    Ascorbic Acid (VITAMIN C) 1000 MG tablet, Take 500 mg by mouth daily., Disp: , Rfl:    B Complex-C (SUPER B COMPLEX PO), Take 1 tablet by mouth daily., Disp: , Rfl:    Biotin 5000 MCG CAPS, Take 5,000 mg by mouth daily., Disp: , Rfl:    Calcium-Magnesium-Zinc (CAL-MAG-ZINC PO), Take 1 tablet by mouth daily., Disp: , Rfl:    Cholecalciferol (VITAMIN D3) 50 MCG (2000 UT) capsule, Take 2,000 Units by mouth daily., Disp: , Rfl:    co-enzyme Q-10 30 MG capsule, Take 30 mg by mouth daily., Disp: , Rfl:    Krill Oil 500 MG CAPS, Take 500 mg by mouth daily., Disp: , Rfl:    Omega-3 Fatty Acids (FISH OIL PO), Take 1,200 mg by mouth daily., Disp: , Rfl:     tamsulosin (FLOMAX) 0.4 MG CAPS capsule, Take 0.4 mg by mouth every other day., Disp: , Rfl:    vitamin E 180 MG (400 UNITS) capsule, Take 800 Units by mouth daily., Disp: , Rfl:    Alpha Lipoic Acid 200 MG CAPS, Take 200 mg by mouth daily. (Patient not taking: Reported on 11/10/2022), Disp: , Rfl:    apixaban (ELIQUIS) 5 MG TABS tablet, Take 1 tablet (5 mg total) by mouth 2 (two) times daily., Disp: 180 tablet, Rfl: 3   sotalol (BETAPACE) 120 MG tablet, Take 1 tablet (120 mg total) by mouth 2 (two) times daily., Disp: 180 tablet, Rfl: 3   valsartan-hydrochlorothiazide (DIOVAN HCT) 160-25 MG tablet, Take 1 tablet by mouth every morning., Disp: 90 tablet, Rfl: 3  Laboratory examination:   Lab Results  Component Value Date   NA 135 10/31/2022   K 4.9 10/31/2022   CO2 23 10/31/2022   GLUCOSE 115 (H) 10/31/2022   BUN 18 10/31/2022   CREATININE 1.24 10/31/2022   CALCIUM 9.2 10/31/2022   EGFR 62 10/31/2022   GFRNONAA >60 09/08/2022       Latest Ref Rng & Units 10/31/2022    2:53 PM 10/07/2022   10:28 AM 09/08/2022    3:48 AM  CMP  Glucose 70 - 99 mg/dL 115  90  122   BUN 8 - 27 mg/dL '18  24  12   '$ Creatinine 0.76 - 1.27 mg/dL 1.24  1.20  0.81   Sodium 134 - 144 mmol/L 135  137  137   Potassium 3.5 - 5.2 mmol/L 4.9  4.3  4.3   Chloride 96 - 106 mmol/L 94  107  100   CO2 20 - 29 mmol/L 23   27   Calcium 8.6 - 10.2 mg/dL 9.2   8.8   Total Protein 6.5 - 8.1 g/dL   6.1   Total Bilirubin 0.3 - 1.2 mg/dL   0.8   Alkaline Phos 38 - 126 U/L   99   AST 15 - 41 U/L   35   ALT 0 - 44 U/L   46       Latest Ref Rng & Units 10/07/2022   10:28 AM 09/08/2022    3:48 AM 09/07/2022    6:00 AM  CBC  WBC 4.0 - 10.5 K/uL  7.2  6.9   Hemoglobin 13.0 - 17.0 g/dL 13.9  12.0  10.9   Hematocrit 39.0 - 52.0 % 41.0  35.6  32.6  Platelets 150 - 400 K/uL  252  213    Lipid Panel Recent Labs    09/07/22 0730  CHOL 162  TRIG 35  LDLCALC 91  VLDL 7  HDL 64  CHOLHDL 2.5    HEMOGLOBIN A1C Lab  Results  Component Value Date   HGBA1C 5.8 (H) 09/07/2022   MPG 120 09/07/2022   TSH Recent Labs    09/06/22 2200  TSH 0.795   BNP (last 3 results) Recent Labs    09/06/22 2000 09/08/22 0348  BNP 274.5* 220.5*   Radiology:   [facility] Chest Port 1 View 09/06/2022 Mild cardiomegaly. Bronchitic changes which appear chronic. No acute airspace disease, pleural effusion or pneumothorax IMPRESSION: No active disease. Chronic bronchitic changes. Mild cardiomegaly.     CT angiogram chest 09/07/2022: 1. No evidence of pulmonary embolism.  2. A 2.1 cm x 1.6 cm x 1.6 cm left lower lobe lung nodule versus focal airspace disease. While this may be infectious in etiology, the presence of an underlying neoplastic process cannot be excluded. Consider one of the following in 3 months for both low-risk and high-risk individual 3. Moderate severity diffuse bilateral peribronchial thickening which may represent sequelae associated with acute or chronic bronchitis.  4. Mild coronary artery calcification.  5. Mild AP window and pretracheal lymphadenopathy which may be reactive in nature.  6. Aortic atherosclerosis.  Cardiac Studies:   Echocardiogram 09/07/2022:  1. Left ventricular ejection fraction, by estimation, is 40 to 45%. The left ventricle has mildly decreased function. The left ventricle demonstrates global hypokinesis. Left ventricular diastolic parameters are indeterminate.  2. Right ventricular systolic function is normal. The right ventricular size is normal. There is mildly elevated pulmonary artery systolic pressure.  Mild TR, PASP 43 mmHg.  3. Left atrial size was severely dilated.  4. Right atrial size was severely dilated.  5. No evidence of mitral valve regurgitation.  6. The aortic valve was not well visualized. Aortic valve regurgitation is not visualized.  7. The inferior vena cava is dilated in size with <50% respiratory variability, suggesting right atrial pressure of 15 mmHg.    Lower extremity venous duplex 09/07/2022: No evidence of DVT.  No cystic structures noted in the popliteal fossa.  PCV MYOCARDIAL PERFUSION WO LEXISCAN 10/05/2022  Narrative Lexiscan Tetrofosmin stress test 10/05/2022: 1 Day Rest/Stress protocol. Stress EKG is non-diagnostic due to baseline ST-T changes. Normal myocardial perfusion without convincing evidence of reversible myocardial ischemia or prior infarct. Left ventricular size mildly dilated, calculated LVEF 25% (visually moderately reduced, global hypokinesis - accuracy may be limited due to underlying rhythm being A-fib with RVR, recommend correlating LVEF with echocardiography). Intermediate risk study -due to dilated LV cavity, reduced LVEF. Clinical correlation required. No prior studies available for comparison.   EKG:   EKG 11/10/2022: Sinus bradycardia at rate of 54 bpm, normal axis, no evidence of ischemia otherwise normal EKG.  Compared to 10/20/2022, marked sinus bradycardia at the rate of 49 bpm is now improved.  09/16/2022: Atrial fibrillation RVR, iRBBB -Nonspecific T-abnormality.  EKG 09/06/2022: Atypical atrial flutter/atrial fibrillation with rapid ventricular sponsor rate of 152 bpm, incomplete right bundle branch block.  Nonspecific T abnormality.  No prior EKG to compare.  Assessment     ICD-10-CM   1. Paroxysmal atrial fibrillation (HCC)  I48.0 EKG 12-Lead    sotalol (BETAPACE) 120 MG tablet    apixaban (ELIQUIS) 5 MG TABS tablet    PCV ECHOCARDIOGRAM COMPLETE    2. Chronic systolic (congestive) heart failure (HCC)  I50.22 PCV ECHOCARDIOGRAM COMPLETE    3. Essential hypertension  I10 valsartan-hydrochlorothiazide (DIOVAN HCT) 160-25 MG tablet    4. Loud snoring  R06.83       CHA2DS2-VASc Score is 3.  Yearly risk of stroke: 3.2% (A, HTN, CHF).  Score of 1=0.6; 2=2.2; 3=3.2; 4=4.8; 5=7.2; 6=9.8; 7=>9.8) -(CHF; HTN; vasc disease DM,  Male = 1; Age <65 =0; 65-74 = 1,  >75 =2; stroke/embolism= 2).     Orders Placed This Encounter  Procedures   EKG 12-Lead   PCV ECHOCARDIOGRAM COMPLETE    Standing Status:   Future    Standing Expiration Date:   11/11/2023    Meds ordered this encounter  Medications   valsartan-hydrochlorothiazide (DIOVAN HCT) 160-25 MG tablet    Sig: Take 1 tablet by mouth every morning.    Dispense:  90 tablet    Refill:  3   sotalol (BETAPACE) 120 MG tablet    Sig: Take 1 tablet (120 mg total) by mouth 2 (two) times daily.    Dispense:  180 tablet    Refill:  3    Do not fill Amiodarone   apixaban (ELIQUIS) 5 MG TABS tablet    Sig: Take 1 tablet (5 mg total) by mouth 2 (two) times daily.    Dispense:  180 tablet    Refill:  3    Medications Discontinued During This Encounter  Medication Reason   sotalol (BETAPACE) 120 MG tablet Reorder   valsartan-hydrochlorothiazide (DIOVAN HCT) 160-25 MG tablet Reorder   apixaban (ELIQUIS) 5 MG TABS tablet Reorder    Recommendations:   OSMIN WELZ is a 72 y.o. male patient with no significant prior cardiovascular history admitted on 09/08/2022 with  pneumonia and also new onset atrial flutter with RVR/atrial fibrillation and new diagnosis of HFrEF, EF 40 to 45%.  On his last office visit on 09/22/2022, I reinitiated sotalol which he was tolerating and he he was scheduled for direct-current cardioversion on 10/07/2022 but found to be in sinus rhythm.  He points for follow-up of hypertension, acute on chronic systolic heart failure and also PAF, I had seen him 4 weeks ago.    1. Paroxysmal atrial fibrillation ([place]) Patient is presently maintaining sinus rhythm, on his last office visit I discontinued metoprolol in view of marked sinus bradycardia.  His heart rate is improved, patient states that overall he feels he is about 95% back to his baseline and is started to resume all his physical activity.  Although 72 years of age he looks very healthy and young.  Continue sotalol 120 mg daily.  QT interval is normal.   Advised him that there is no restriction from cardiac standpoint.  2.  Chronic systolic (congestive) heart failure ([place]) Now that he is maintaining sinus rhythm, I expect LVEF to improve.  His symptoms of dyspnea and fatigue is also improved since last office visit 6 weeks ago.  Hence we will continue with present medical therapy, will not switch him to [facility].  Suspect his cardiomyopathy is related to new onset atrial fibrillation with rapid ventricular response.  3. Essential hypertension Blood pressure is well-controlled on valsartan HCT, BMP is normal.  Renal function is normal.  4. Loud snoring Patient has apneic episodes that is witnessed by his wife, loud snoring ongoing for several years.  He has had difficulty with setting up the appointment but he finally has touch base with them today, he will go through the sleep study.  I will see him back  in 6 months with repeat echocardiogram, if indeed he is found to have sleep apnea and gets treated with CPAP then we could consider either reducing the dose of sotalol or even discontinue sotalol and see how he does.  I reassured the patient.      [name], MD, [facility] 11/10/2022, 4:30 PM Office: [phone] Fax: [phone] Pager: [phone]

## 2022-12-12 DIAGNOSIS — Z789 Other specified health status: Secondary | ICD-10-CM | POA: Diagnosis not present

## 2022-12-12 DIAGNOSIS — L538 Other specified erythematous conditions: Secondary | ICD-10-CM | POA: Diagnosis not present

## 2022-12-12 DIAGNOSIS — L578 Other skin changes due to chronic exposure to nonionizing radiation: Secondary | ICD-10-CM | POA: Diagnosis not present

## 2022-12-12 DIAGNOSIS — L821 Other seborrheic keratosis: Secondary | ICD-10-CM | POA: Diagnosis not present

## 2022-12-12 DIAGNOSIS — L570 Actinic keratosis: Secondary | ICD-10-CM | POA: Diagnosis not present

## 2022-12-12 DIAGNOSIS — D225 Melanocytic nevi of trunk: Secondary | ICD-10-CM | POA: Diagnosis not present

## 2022-12-12 DIAGNOSIS — Z08 Encounter for follow-up examination after completed treatment for malignant neoplasm: Secondary | ICD-10-CM | POA: Diagnosis not present

## 2022-12-12 DIAGNOSIS — C44612 Basal cell carcinoma of skin of right upper limb, including shoulder: Secondary | ICD-10-CM | POA: Diagnosis not present

## 2022-12-12 DIAGNOSIS — L298 Other pruritus: Secondary | ICD-10-CM | POA: Diagnosis not present

## 2022-12-12 DIAGNOSIS — L814 Other melanin hyperpigmentation: Secondary | ICD-10-CM | POA: Diagnosis not present

## 2022-12-12 DIAGNOSIS — L820 Inflamed seborrheic keratosis: Secondary | ICD-10-CM | POA: Diagnosis not present

## 2022-12-12 DIAGNOSIS — D485 Neoplasm of uncertain behavior of skin: Secondary | ICD-10-CM | POA: Diagnosis not present

## 2023-04-24 ENCOUNTER — Ambulatory Visit: Payer: Medicare Other

## 2023-04-24 DIAGNOSIS — I480 Paroxysmal atrial fibrillation: Secondary | ICD-10-CM | POA: Diagnosis not present

## 2023-04-24 DIAGNOSIS — I5022 Chronic systolic (congestive) heart failure: Secondary | ICD-10-CM

## 2023-04-27 NOTE — Progress Notes (Signed)
Echocardiogram 04/24/2023: EKG rhythm strip: Sinus rhythm  Normal LV systolic function with visual EF 55-60%. Left ventricle cavity is normal in size. Normal left ventricular wall thickness. Normal global wall motion. Normal diastolic filling pattern, normal LAP.  Mild tricuspid regurgitation. No evidence of pulmonary hypertension. Compared to 09/07/2022: LVEF improved from 40-45% to 55-60%, Mild PHTN / biatrial enlargement / estimated RAP are not appreciated on current study.

## 2023-05-11 ENCOUNTER — Ambulatory Visit: Payer: Medicare Other | Admitting: Cardiology

## 2023-06-06 ENCOUNTER — Ambulatory Visit: Payer: Medicare Other | Admitting: Cardiology

## 2023-06-06 ENCOUNTER — Encounter: Payer: Self-pay | Admitting: Cardiology

## 2023-06-06 VITALS — BP 118/64 | HR 55 | Resp 16 | Ht 67.0 in | Wt 177.2 lb

## 2023-06-06 DIAGNOSIS — Z5181 Encounter for therapeutic drug level monitoring: Secondary | ICD-10-CM | POA: Diagnosis not present

## 2023-06-06 DIAGNOSIS — Z79899 Other long term (current) drug therapy: Secondary | ICD-10-CM

## 2023-06-06 DIAGNOSIS — I10 Essential (primary) hypertension: Secondary | ICD-10-CM

## 2023-06-06 DIAGNOSIS — I480 Paroxysmal atrial fibrillation: Secondary | ICD-10-CM

## 2023-06-06 DIAGNOSIS — Z7901 Long term (current) use of anticoagulants: Secondary | ICD-10-CM | POA: Diagnosis not present

## 2023-06-06 NOTE — Progress Notes (Unsigned)
Primary Physician/Referring:  Georgina Quint, MD  Patient ID: [name], male    DOB: Mar 31, 1951, 72 y.o.   MRN: [id number]  Chief Complaint  Patient presents with   Paroxysmal atrial fibrillation    Acute on chronic combined systolic and diastolic CHF (conge   Follow-up    6 months   HPI:    Stephen Cortez  is a 72 y.o.  male patient with no significant prior cardiovascular history admitted on 09/08/2022 with  pneumonia and also new onset atrial flutter with RVR/atrial fibrillation and new diagnosis of HFrEF, EF 40 to 45%.  On his last office visit on 09/22/2022, I reinitiated sotalol which he was tolerating and he he was scheduled for direct-current cardioversion on 10/07/2022 but found to be in sinus rhythm.  He points for follow-up of hypertension, acute on chronic systolic heart failure and also PAF, I had seen him 4 weeks ago.    On his last office visit due to marked fatigue, shortness of breath, bradycardia, I had discontinued metoprolol and continued sotalol.  He now presents for follow-up, states that his dyspnea has improved, fatigue has resolved, he feels like he is back to his baseline at at least 95% or so.  He is very pleased with this.  No further leg edema, no PND or orthopnea.  Tolerating all his medications well.  Past Medical History:  Diagnosis Date   Arrhythmia 09/08/2022   A. Fib with RVR   CHF (congestive heart failure) (HCC) 09/08/2022   LVEF 40-45%   Past Surgical History:  Procedure Laterality Date   CARDIOVERSION N/A 10/07/2022   Procedure: CARDIOVERSION;  Surgeon: Yates Decamp, MD;  Location: [facility] ENDOSCOPY;  Service: Cardiovascular;  Laterality: N/A;   MINOR CARPAL TUNNEL     Family History  Problem Relation Age of Onset   Atrial fibrillation Mother    Atrial fibrillation Father    Cancer - Prostate Father    Heart attack Maternal Grandfather     Social History   Tobacco Use   Smoking status: Never   Smokeless tobacco: Never   Substance Use Topics   Alcohol use: Not Currently   Marital Status: Married  ROS  Review of Systems  Cardiovascular:  Negative for chest pain, dyspnea on exertion and leg swelling.   Objective  Blood pressure 118/64, pulse (!) 55, resp. rate 16, height 5\' 7"  (1.702 m), weight 177 lb 3.2 oz (80.4 kg), SpO2 97%. Body mass index is 27.75 kg/m.     06/06/2023    2:43 PM 11/10/2022    3:51 PM 10/20/2022    1:41 PM  Vitals with BMI  Height 5\' 7"  5\' 7"  5\' 7"   Weight 177 lbs 3 oz 177 lbs 192 lbs 3 oz  BMI 27.75 27.72 30.1  Systolic 118 121 409  Diastolic 64 73 88  Pulse 55 59 49     Physical Exam Neck:     Vascular: JVD present. No carotid bruit.  Cardiovascular:     Rate and Rhythm: Normal rate and regular rhythm.     Pulses: Normal pulses and intact distal pulses.     Heart sounds: No murmur heard. Pulmonary:     Effort: Pulmonary effort is normal.     Breath sounds: Normal breath sounds.  Abdominal:     General: Bowel sounds are normal.     Palpations: Abdomen is soft.  Musculoskeletal:     Right lower leg: Edema (Trace) present.     Left lower  leg: Edema (Trace) present.  Skin:    Capillary Refill: Capillary refill takes less than 2 seconds.     Medications and allergies  No Known Allergies   Medication list after today's encounter   Current Outpatient Medications:    Acetylcarnitine HCl (ACETYL L-CARNITINE PO), Take 400 mg by mouth daily., Disp: , Rfl:    Alpha Lipoic Acid 200 MG CAPS, Take 200 mg by mouth daily., Disp: , Rfl:    apixaban (ELIQUIS) 5 MG TABS tablet, Take 1 tablet (5 mg total) by mouth 2 (two) times daily., Disp: 180 tablet, Rfl: 3   Ascorbic Acid (VITAMIN C) 1000 MG tablet, Take 500 mg by mouth daily., Disp: , Rfl:    B Complex-C (SUPER B COMPLEX PO), Take 1 tablet by mouth daily., Disp: , Rfl:    Biotin 5000 MCG CAPS, Take 5,000 mg by mouth daily., Disp: , Rfl:    Calcium-Magnesium-Zinc (CAL-MAG-ZINC PO), Take 1 tablet by mouth daily., Disp: ,  Rfl:    Cholecalciferol (VITAMIN D3) 50 MCG (2000 UT) capsule, Take 2,000 Units by mouth daily., Disp: , Rfl:    co-enzyme Q-10 30 MG capsule, Take 30 mg by mouth daily., Disp: , Rfl:    Omega-3 Fatty Acids (FISH OIL PO), Take 1,200 mg by mouth daily., Disp: , Rfl:    sotalol (BETAPACE) 120 MG tablet, Take 1 tablet (120 mg total) by mouth 2 (two) times daily., Disp: 180 tablet, Rfl: 3   tamsulosin (FLOMAX) 0.4 MG CAPS capsule, Take 0.4 mg by mouth every other day., Disp: , Rfl:    valsartan-hydrochlorothiazide (DIOVAN HCT) 160-25 MG tablet, Take 1 tablet by mouth every morning., Disp: 90 tablet, Rfl: 3   vitamin E 180 MG (400 UNITS) capsule, Take 800 Units by mouth daily., Disp: , Rfl:   Laboratory examination:   Lab Results  Component Value Date   NA 135 10/31/2022   K 4.9 10/31/2022   CO2 23 10/31/2022   GLUCOSE 115 (H) 10/31/2022   BUN 18 10/31/2022   CREATININE 1.24 10/31/2022   CALCIUM 9.2 10/31/2022   EGFR 62 10/31/2022   GFRNONAA >60 09/08/2022       Latest Ref Rng & Units 10/31/2022    2:53 PM 10/07/2022   10:28 AM 09/08/2022    3:48 AM  CMP  Glucose 70 - 99 mg/dL 010  90  272   BUN 8 - 27 mg/dL 18  24  12    Creatinine 0.76 - 1.27 mg/dL 5.36  6.44  0.34   Sodium 134 - 144 mmol/L 135  137  137   Potassium 3.5 - 5.2 mmol/L 4.9  4.3  4.3   Chloride 96 - 106 mmol/L 94  107  100   CO2 20 - 29 mmol/L 23   27   Calcium 8.6 - 10.2 mg/dL 9.2   8.8   Total Protein 6.5 - 8.1 g/dL   6.1   Total Bilirubin 0.3 - 1.2 mg/dL   0.8   Alkaline Phos 38 - 126 U/L   99   AST 15 - 41 U/L   35   ALT 0 - 44 U/L   46       Latest Ref Rng & Units 10/07/2022   10:28 AM 09/08/2022    3:48 AM 09/07/2022    6:00 AM  CBC  WBC 4.0 - 10.5 K/uL  7.2  6.9   Hemoglobin 13.0 - 17.0 g/dL 74.2  59.5  63.8   Hematocrit 39.0 - 52.0 %  41.0  35.6  32.6   Platelets 150 - 400 K/uL  252  213    Lipid Panel Recent Labs    09/07/22 0730  CHOL 162  TRIG 35  LDLCALC 91  VLDL 7  HDL 64  CHOLHDL  2.5    HEMOGLOBIN A1C Lab Results  Component Value Date   HGBA1C 5.8 (H) 09/07/2022   MPG 120 09/07/2022   TSH Recent Labs    09/06/22 2200  TSH 0.795   BNP (last 3 results) Recent Labs    09/06/22 2000 09/08/22 0348  BNP 274.5* 220.5*   Radiology:   [facility] Chest Port 1 View 09/06/2022 Mild cardiomegaly. Bronchitic changes which appear chronic. No acute airspace disease, pleural effusion or pneumothorax IMPRESSION: No active disease. Chronic bronchitic changes. Mild cardiomegaly.     CT angiogram chest 09/07/2022: 1. No evidence of pulmonary embolism.  2. A 2.1 cm x 1.6 cm x 1.6 cm left lower lobe lung nodule versus focal airspace disease. While this may be infectious in etiology, the presence of an underlying neoplastic process cannot be excluded. Consider one of the following in 3 months for both low-risk and high-risk individual 3. Moderate severity diffuse bilateral peribronchial thickening which may represent sequelae associated with acute or chronic bronchitis.  4. Mild coronary artery calcification.  5. Mild AP window and pretracheal lymphadenopathy which may be reactive in nature.  6. Aortic atherosclerosis.  Cardiac Studies:    Lower extremity venous duplex 09/07/2022: No evidence of DVT.  No cystic structures noted in the popliteal fossa.  PCV MYOCARDIAL PERFUSION WO LEXISCAN 10/05/2022  Narrative Lexiscan Tetrofosmin stress test 10/05/2022: 1 Day Rest/Stress protocol. Stress EKG is non-diagnostic due to baseline ST-T changes. Normal myocardial perfusion without convincing evidence of reversible myocardial ischemia or prior infarct. Left ventricular size mildly dilated, calculated LVEF 25% (visually moderately reduced, global hypokinesis - accuracy may be limited due to underlying rhythm being A-fib with RVR, recommend correlating LVEF with echocardiography). Intermediate risk study -due to dilated LV cavity, reduced LVEF. Clinical correlation required. No  prior studies available for comparison.  Echocardiogram 04/24/2023: EKG rhythm strip: Sinus rhythm Normal LV systolic function with visual EF 55-60%. Left ventricle cavity is normal in size. Normal left ventricular wall thickness. Normal global wall motion. Normal diastolic filling pattern, normal LAP. Mild tricuspid regurgitation. No evidence of pulmonary hypertension. Compared to 09/07/2022: LVEF improved from 40-45% to 55-60%, Mild PHTN / biatrial enlargement / estimated RAP are not appreciated on current study.   EKG:   EKG 9 06/06/2023: EKG 06/06/2023: Normal sinus rhythm at rate of 54 bpm, normal EKG.  Compared to 11/10/2022, no change.  09/16/2022: Atrial fibrillation RVR, iRBBB -Nonspecific T-abnormality.  Assessment     ICD-10-CM   1. Paroxysmal atrial fibrillation (HCC)  I48.0 EKG 12-Lead    2. Essential hypertension  I10     3. High risk medication use  Z79.899       CHA2DS2-VASc Score is 3.  Yearly risk of stroke: 3.2% (A, HTN, CHF).  Score of 1=0.6; 2=2.2; 3=3.2; 4=4.8; 5=7.2; 6=9.8; 7=>9.8) -(CHF; HTN; vasc disease DM,  Male = 1; Age <65 =0; 65-74 = 1,  >75 =2; stroke/embolism= 2).    Orders Placed This Encounter  Procedures   EKG 12-Lead    No orders of the defined types were placed in this encounter.   Medications Discontinued During This Encounter  Medication Reason   Krill Oil 500 MG CAPS     Recommendations:   Osvaldo Agler  Enciso is a 72 y.o. male patient with no significant prior cardiovascular history admitted on 09/08/2022 with  pneumonia and also new onset atrial flutter with RVR/atrial fibrillation and new diagnosis of HFrEF, EF 40 to 45%.  On his last office visit on 09/22/2022, I reinitiated sotalol which he was tolerating and he he was scheduled for direct-current cardioversion on 10/07/2022 but found to be in sinus rhythm.  He points for follow-up of hypertension, acute on chronic systolic heart failure and also PAF, I had seen him 4 weeks ago.     1. Paroxysmal atrial fibrillation (HCC) Patient is presently maintaining sinus rhythm, on his last office visit I discontinued metoprolol in view of marked sinus bradycardia.  His heart rate is improved, patient states that overall he feels he is about 95% back to his baseline and is started to resume all his physical activity.  Although 72 years of age he looks very healthy and young.  Continue sotalol 120 mg daily.  QT interval is normal.  Advised him that there is no restriction from cardiac standpoint.  2.  Chronic systolic (congestive) heart failure (HCC) Now that he is maintaining sinus rhythm, I expect LVEF to improve.  His symptoms of dyspnea and fatigue is also improved since last office visit 6 weeks ago.  Hence we will continue with present medical therapy, will not switch him to [facility].  Suspect his cardiomyopathy is related to new onset atrial fibrillation with rapid ventricular response.  3. Essential hypertension Blood pressure is well-controlled on valsartan HCT, BMP is normal.  Renal function is normal.  4. Loud snoring Patient has apneic episodes that is witnessed by his wife, loud snoring ongoing for several years.  He has had difficulty with setting up the appointment but he finally has touch base with them today, he will go through the sleep study.  I will see him back in 6 months with repeat echocardiogram, if indeed he is found to have sleep apnea and gets treated with CPAP then we could consider either reducing the dose of sotalol or even discontinue sotalol and see how he does.  I reassured the patient.      Yates Decamp, MD, [facility] 06/06/2023, 3:09 PM Office: [phone] Fax: [phone] Pager: [phone]

## 2023-06-15 DIAGNOSIS — L814 Other melanin hyperpigmentation: Secondary | ICD-10-CM | POA: Diagnosis not present

## 2023-06-15 DIAGNOSIS — D485 Neoplasm of uncertain behavior of skin: Secondary | ICD-10-CM | POA: Diagnosis not present

## 2023-06-15 DIAGNOSIS — Z789 Other specified health status: Secondary | ICD-10-CM | POA: Diagnosis not present

## 2023-06-15 DIAGNOSIS — L820 Inflamed seborrheic keratosis: Secondary | ICD-10-CM | POA: Diagnosis not present

## 2023-06-15 DIAGNOSIS — L298 Other pruritus: Secondary | ICD-10-CM | POA: Diagnosis not present

## 2023-06-15 DIAGNOSIS — L538 Other specified erythematous conditions: Secondary | ICD-10-CM | POA: Diagnosis not present

## 2023-06-15 DIAGNOSIS — L821 Other seborrheic keratosis: Secondary | ICD-10-CM | POA: Diagnosis not present

## 2023-06-15 DIAGNOSIS — D225 Melanocytic nevi of trunk: Secondary | ICD-10-CM | POA: Diagnosis not present

## 2023-11-13 ENCOUNTER — Other Ambulatory Visit: Payer: Self-pay | Admitting: Cardiology

## 2023-11-13 DIAGNOSIS — I10 Essential (primary) hypertension: Secondary | ICD-10-CM

## 2023-12-07 ENCOUNTER — Encounter: Payer: Self-pay | Admitting: Cardiology

## 2023-12-07 ENCOUNTER — Ambulatory Visit: Payer: Medicare Other | Attending: Cardiology | Admitting: Cardiology

## 2023-12-07 VITALS — BP 130/84 | HR 63 | Resp 16 | Ht 67.0 in | Wt 183.6 lb

## 2023-12-07 DIAGNOSIS — I10 Essential (primary) hypertension: Secondary | ICD-10-CM | POA: Diagnosis not present

## 2023-12-07 DIAGNOSIS — I480 Paroxysmal atrial fibrillation: Secondary | ICD-10-CM

## 2023-12-07 DIAGNOSIS — Z5181 Encounter for therapeutic drug level monitoring: Secondary | ICD-10-CM

## 2023-12-07 NOTE — Patient Instructions (Signed)
 Medication Instructions:  Your physician recommends that you continue on your current medications as directed. Please refer to the Current Medication list given to you today.  *If you need a refill on your cardiac medications before your next appointment, please call your pharmacy*   Lab Work: Have CBC, CMET and Magnesium drawn today at [facility] on the first floor If you have labs (blood work) drawn today and your tests are completely normal, you will receive your results only by: MyChart Message (if you have MyChart) OR A paper copy in the mail If you have any lab test that is abnormal or we need to change your treatment, we will call you to review the results.   Testing/Procedures: none   Follow-Up: At [facility], you and your health needs are our priority.  As part of our continuing mission to provide you with exceptional heart care, we have created designated Provider Care Teams.  These Care Teams include your primary Cardiologist (physician) and Advanced Practice Providers (APPs -  Physician Assistants and Nurse Practitioners) who all work together to provide you with the care you need, when you need it.  We recommend signing up for the patient portal called "MyChart".  Sign up information is provided on this After Visit Summary.  MyChart is used to connect with patients for Virtual Visits (Telemedicine).  Patients are able to view lab/test results, encounter notes, upcoming appointments, etc.  Non-urgent messages can be sent to your provider as well.   To learn more about what you can do with MyChart, go to [email or web address].    Your next appointment:   12 month(s)  Provider:   Yates Decamp, MD     Other Instructions

## 2023-12-07 NOTE — Progress Notes (Signed)
 Cardiology Office Note:  .   Date:  12/07/2023  ID:  Stephen Cortez, DOB 16-Mar-1951, MRN [id number] PCP: Georgina Quint, MD  [facility] Providers Cardiologist:  Yates Decamp, MD   History of Present Illness: .   Stephen Cortez is a 73 y.o. male patient with no significant prior cardiovascular history admitted on 09/08/2022 with  pneumonia and also new onset atrial flutter with RVR/atrial fibrillation and new diagnosis of HFrEF, EF 40 to 45%.  On his office visit on 09/22/2022, I reinitiated sotalol and patient spontaneously converted to sinus rhythm.  Since maintaining sinus rhythm, EF has normalized by echocardiogram on 04/24/2023.  Discussed the use of AI scribe software for clinical note transcription with the patient, who gave verbal consent to proceed.  History of Present Illness   The patient, with a history of paroxysmal atrial fibrillation (PAF) and hypertension, presents for a routine follow-up. He reports feeling 'pretty good' with no new or worsening symptoms. He denies any symptoms of heart racing and reports that his blood pressure has been well controlled. He continues to be active, even running up five flights of stairs without any difficulty or symptoms. He is currently on sotalol, valsartan HCT, and Eliquis.     Labs   Lab Results  Component Value Date   CHOL 162 09/07/2022   HDL 64 09/07/2022   LDLCALC 91 09/07/2022   TRIG 35 09/07/2022   CHOLHDL 2.5 09/07/2022   Lab Results  Component Value Date   NA 135 10/31/2022   K 4.9 10/31/2022   CO2 23 10/31/2022   GLUCOSE 115 (H) 10/31/2022   BUN 18 10/31/2022   CREATININE 1.24 10/31/2022   CALCIUM 9.2 10/31/2022   EGFR 62 10/31/2022   GFRNONAA >60 09/08/2022      Latest Ref Rng & Units 10/31/2022    2:53 PM 10/07/2022   10:28 AM 09/08/2022    3:48 AM  BMP  Glucose 70 - 99 mg/dL 409  90  811   BUN 8 - 27 mg/dL 18  24  12    Creatinine 0.76 - 1.27 mg/dL 9.14  7.82  9.56   BUN/Creat Ratio 10 - 24  15     Sodium 134 - 144 mmol/L 135  137  137   Potassium 3.5 - 5.2 mmol/L 4.9  4.3  4.3   Chloride 96 - 106 mmol/L 94  107  100   CO2 20 - 29 mmol/L 23   27   Calcium 8.6 - 10.2 mg/dL 9.2   8.8       Latest Ref Rng & Units 10/07/2022   10:28 AM 09/08/2022    3:48 AM 09/07/2022    6:00 AM  CBC  WBC 4.0 - 10.5 K/uL  7.2  6.9   Hemoglobin 13.0 - 17.0 g/dL 21.3  08.6  57.8   Hematocrit 39.0 - 52.0 % 41.0  35.6  32.6   Platelets 150 - 400 K/uL  252  213    Lab Results  Component Value Date   HGBA1C 5.8 (H) 09/07/2022    Lab Results  Component Value Date   TSH 0.795 09/06/2022    Review of Systems  Cardiovascular:  Negative for chest pain, dyspnea on exertion and leg swelling.   Physical Exam:   VS:  BP 130/84 (BP Location: Left Arm, Patient Position: Sitting, Cuff Size: Normal)   Pulse 63   Resp 16   Ht 5\' 7"  (1.702 m)   Wt 183 lb 9.6  oz (83.3 kg)   SpO2 97%   BMI 28.76 kg/m    Wt Readings from Last 3 Encounters:  12/07/23 183 lb 9.6 oz (83.3 kg)  06/06/23 177 lb 3.2 oz (80.4 kg)  11/10/22 177 lb (80.3 kg)     Physical Exam Neck:     Vascular: No carotid bruit or JVD.  Cardiovascular:     Rate and Rhythm: Normal rate and regular rhythm.     Pulses: Intact distal pulses.     Heart sounds: Normal heart sounds. No murmur heard.    No gallop.  Pulmonary:     Effort: Pulmonary effort is normal.     Breath sounds: Normal breath sounds.  Abdominal:     General: Bowel sounds are normal.     Palpations: Abdomen is soft.  Musculoskeletal:     Right lower leg: No edema.     Left lower leg: No edema.    Studies Reviewed: Marland Kitchen     EKG:    EKG Interpretation Date/Time:  Thursday December 07 2023 15:57:31 EST Ventricular Rate:  65 PR Interval:  134 QRS Duration:  78 QT Interval:  408 QTC Calculation: 424 R Axis:   49  Text Interpretation: EKG 12/07/2023: Normal sinus rhythm at the rate of 65 bpm, normal EKG.  Normal QT interval.  Compared to 06/06/2023, no change.  Confirmed by Delrae Rend [phone]) on 12/07/2023 4:04:17 PM    Medications and allergies    No Known Allergies   Current Outpatient Medications:    Acetylcarnitine HCl (ACETYL L-CARNITINE PO), Take 400 mg by mouth daily., Disp: , Rfl:    Alpha Lipoic Acid 200 MG CAPS, Take 200 mg by mouth daily., Disp: , Rfl:    apixaban (ELIQUIS) 5 MG TABS tablet, Take 1 tablet (5 mg total) by mouth 2 (two) times daily., Disp: 180 tablet, Rfl: 3   Ascorbic Acid (VITAMIN C) 1000 MG tablet, Take 500 mg by mouth daily., Disp: , Rfl:    B Complex-C (SUPER B COMPLEX PO), Take 1 tablet by mouth daily., Disp: , Rfl:    Biotin 5000 MCG CAPS, Take 5,000 mg by mouth daily., Disp: , Rfl:    Calcium-Magnesium-Zinc (CAL-MAG-ZINC PO), Take 1 tablet by mouth daily., Disp: , Rfl:    Cholecalciferol (VITAMIN D3) 50 MCG (2000 UT) capsule, Take 2,000 Units by mouth daily., Disp: , Rfl:    co-enzyme Q-10 30 MG capsule, Take 30 mg by mouth daily., Disp: , Rfl:    Omega-3 Fatty Acids (FISH OIL PO), Take 1,200 mg by mouth daily., Disp: , Rfl:    sotalol (BETAPACE) 120 MG tablet, Take 1 tablet (120 mg total) by mouth 2 (two) times daily., Disp: 180 tablet, Rfl: 3   tamsulosin (FLOMAX) 0.4 MG CAPS capsule, Take 0.4 mg by mouth every other day., Disp: , Rfl:    valsartan-hydrochlorothiazide (DIOVAN-HCT) 160-25 MG tablet, TAKE ONE TABLET BY MOUTH DAILY IN THE MORNING, Disp: 90 tablet, Rfl: 2   vitamin E 180 MG (400 UNITS) capsule, Take 800 Units by mouth daily., Disp: , Rfl:    ASSESSMENT AND PLAN: .      ICD-10-CM   1. Paroxysmal atrial fibrillation (HCC)  I48.0 EKG 12-Lead    CBC    Comp Met (CMET)    Magnesium    2. Essential hypertension  I10 CBC    Comp Met (CMET)    Magnesium    3. Therapeutic drug monitoring  Z51.81 CBC    Comp Met (CMET)  Magnesium     Click Here to Calculate/Change CHADS2VASc Score The patient's CHADS2-VASc score is 3, indicating a 3.2% annual risk of stroke.  Therefore, anticoagulation  is recommended.   CHF History: Yes HTN History: Yes Diabetes History: No Stroke History: No Vascular Disease History: No   Assessment and Plan    Atrial Fibrillation (AFib) AFib is well-controlled with sotalol 120 mg twice daily and Eliquis for stroke prevention. There have been no recent episodes of palpitations or other symptoms, and the EKG is normal. Blood work is necessary to monitor magnesium, potassium, renal function, CBC, and therapeutic drug levels due to sotalol use. Continue sotalol 120 mg twice daily and Eliquis. Order blood work including magnesium, potassium, renal function, CBC, and therapeutic drug monitoring.  Hypertension Hypertension is well-controlled with valsartan HCT 160/25 mg once daily. Blood pressure is optimal. Continue valsartan HCT 160/25 mg once daily.  General Health Maintenance Maintain physical activity and avoid weight gain. Encourage continued physical activity and avoid weight gain.  Follow-up in one year.           Signed,  Yates Decamp, MD, [facility] 12/07/2023, 4:42 PM [facility] [address] [place], [place] [address] Phone: [phone]. Fax:  [phone]

## 2023-12-08 ENCOUNTER — Encounter: Payer: Self-pay | Admitting: Cardiology

## 2023-12-08 LAB — CBC
Hematocrit: 41.1 % (ref 37.5–51.0)
Hemoglobin: 14 g/dL (ref 13.0–17.7)
MCH: 32.8 pg (ref 26.6–33.0)
MCHC: 34.1 g/dL (ref 31.5–35.7)
MCV: 96 fL (ref 79–97)
Platelets: 267 10*3/uL (ref 150–450)
RBC: 4.27 x10E6/uL (ref 4.14–5.80)
RDW: 12.7 % (ref 11.6–15.4)
WBC: 8 10*3/uL (ref 3.4–10.8)

## 2023-12-08 LAB — COMPREHENSIVE METABOLIC PANEL
ALT: 18 [IU]/L (ref 0–44)
AST: 24 [IU]/L (ref 0–40)
Albumin: 4.5 g/dL (ref 3.8–4.8)
Alkaline Phosphatase: 73 [IU]/L (ref 44–121)
BUN/Creatinine Ratio: 14 (ref 10–24)
BUN: 15 mg/dL (ref 8–27)
Bilirubin Total: 0.5 mg/dL (ref 0.0–1.2)
CO2: 25 mmol/L (ref 20–29)
Calcium: 9.5 mg/dL (ref 8.6–10.2)
Chloride: 98 mmol/L (ref 96–106)
Creatinine, Ser: 1.08 mg/dL (ref 0.76–1.27)
Globulin, Total: 2.4 g/dL (ref 1.5–4.5)
Glucose: 104 mg/dL — ABNORMAL HIGH (ref 70–99)
Potassium: 4.7 mmol/L (ref 3.5–5.2)
Sodium: 140 mmol/L (ref 134–144)
Total Protein: 6.9 g/dL (ref 6.0–8.5)
eGFR: 72 mL/min/{1.73_m2} (ref >59)

## 2023-12-08 LAB — MAGNESIUM: Magnesium: 2 mg/dL (ref 1.6–2.3)

## 2023-12-08 NOTE — Progress Notes (Signed)
 Normal labs minimal hyperglycemia

## 2023-12-13 DIAGNOSIS — Z789 Other specified health status: Secondary | ICD-10-CM | POA: Diagnosis not present

## 2023-12-13 DIAGNOSIS — L820 Inflamed seborrheic keratosis: Secondary | ICD-10-CM | POA: Diagnosis not present

## 2023-12-13 DIAGNOSIS — L538 Other specified erythematous conditions: Secondary | ICD-10-CM | POA: Diagnosis not present

## 2023-12-13 DIAGNOSIS — D485 Neoplasm of uncertain behavior of skin: Secondary | ICD-10-CM | POA: Diagnosis not present

## 2023-12-13 DIAGNOSIS — L570 Actinic keratosis: Secondary | ICD-10-CM | POA: Diagnosis not present

## 2023-12-13 DIAGNOSIS — D225 Melanocytic nevi of trunk: Secondary | ICD-10-CM | POA: Diagnosis not present

## 2023-12-13 DIAGNOSIS — L821 Other seborrheic keratosis: Secondary | ICD-10-CM | POA: Diagnosis not present

## 2023-12-13 DIAGNOSIS — L814 Other melanin hyperpigmentation: Secondary | ICD-10-CM | POA: Diagnosis not present

## 2023-12-13 DIAGNOSIS — R208 Other disturbances of skin sensation: Secondary | ICD-10-CM | POA: Diagnosis not present

## 2024-01-25 ENCOUNTER — Other Ambulatory Visit: Payer: Self-pay | Admitting: Cardiology

## 2024-01-25 DIAGNOSIS — I480 Paroxysmal atrial fibrillation: Secondary | ICD-10-CM

## 2024-02-07 DIAGNOSIS — G8929 Other chronic pain: Secondary | ICD-10-CM | POA: Diagnosis not present

## 2024-02-07 DIAGNOSIS — M25572 Pain in left ankle and joints of left foot: Secondary | ICD-10-CM | POA: Diagnosis not present

## 2024-03-01 DIAGNOSIS — M25572 Pain in left ankle and joints of left foot: Secondary | ICD-10-CM | POA: Diagnosis not present

## 2024-03-07 DIAGNOSIS — H5203 Hypermetropia, bilateral: Secondary | ICD-10-CM | POA: Diagnosis not present

## 2024-03-07 DIAGNOSIS — H35372 Puckering of macula, left eye: Secondary | ICD-10-CM | POA: Diagnosis not present

## 2024-03-11 DIAGNOSIS — M25572 Pain in left ankle and joints of left foot: Secondary | ICD-10-CM | POA: Diagnosis not present

## 2024-03-11 DIAGNOSIS — M898X9 Other specified disorders of bone, unspecified site: Secondary | ICD-10-CM | POA: Diagnosis not present

## 2024-03-11 DIAGNOSIS — G8929 Other chronic pain: Secondary | ICD-10-CM | POA: Diagnosis not present

## 2024-03-11 DIAGNOSIS — M85562 Aneurysmal bone cyst, left lower leg: Secondary | ICD-10-CM | POA: Diagnosis not present

## 2024-03-22 ENCOUNTER — Other Ambulatory Visit: Payer: Self-pay | Admitting: Cardiology

## 2024-03-22 DIAGNOSIS — I480 Paroxysmal atrial fibrillation: Secondary | ICD-10-CM

## 2024-03-22 NOTE — Telephone Encounter (Signed)
 Prescription refill request for Eliquis  received. Indication:afib Last office visit:2/25 Scr:1.08  2/25 Age: 73 Weight:83.3  kg  Prescription refilled

## 2024-06-17 DIAGNOSIS — L570 Actinic keratosis: Secondary | ICD-10-CM | POA: Diagnosis not present

## 2024-06-17 DIAGNOSIS — Z08 Encounter for follow-up examination after completed treatment for malignant neoplasm: Secondary | ICD-10-CM | POA: Diagnosis not present

## 2024-06-17 DIAGNOSIS — L821 Other seborrheic keratosis: Secondary | ICD-10-CM | POA: Diagnosis not present

## 2024-06-17 DIAGNOSIS — L814 Other melanin hyperpigmentation: Secondary | ICD-10-CM | POA: Diagnosis not present

## 2024-06-17 DIAGNOSIS — L578 Other skin changes due to chronic exposure to nonionizing radiation: Secondary | ICD-10-CM | POA: Diagnosis not present

## 2024-07-30 ENCOUNTER — Other Ambulatory Visit: Payer: Self-pay | Admitting: Cardiology

## 2024-07-30 DIAGNOSIS — I10 Essential (primary) hypertension: Secondary | ICD-10-CM

## 2024-10-29 ENCOUNTER — Other Ambulatory Visit: Payer: Self-pay | Admitting: Cardiology

## 2024-10-29 DIAGNOSIS — I480 Paroxysmal atrial fibrillation: Secondary | ICD-10-CM

## 2024-11-15 ENCOUNTER — Other Ambulatory Visit: Payer: Self-pay | Admitting: Cardiology

## 2024-11-15 DIAGNOSIS — I10 Essential (primary) hypertension: Secondary | ICD-10-CM
